# Patient Record
Sex: Male | Born: 1960 | Race: White
Health system: Northeastern US, Academic
[De-identification: ages and names within clinical notes are randomized; demographics above are authoritative.]

## PROBLEM LIST (undated history)

## (undated) DIAGNOSIS — I4891 Unspecified atrial fibrillation: Secondary | ICD-10-CM

## (undated) DIAGNOSIS — D649 Anemia, unspecified: Secondary | ICD-10-CM

## (undated) DIAGNOSIS — R0600 Dyspnea, unspecified: Secondary | ICD-10-CM

## (undated) DIAGNOSIS — E785 Hyperlipidemia, unspecified: Secondary | ICD-10-CM

## (undated) DIAGNOSIS — F32A Depression, unspecified: Secondary | ICD-10-CM

## (undated) DIAGNOSIS — G629 Polyneuropathy, unspecified: Secondary | ICD-10-CM

## (undated) DIAGNOSIS — E079 Disorder of thyroid, unspecified: Secondary | ICD-10-CM

## (undated) DIAGNOSIS — Z973 Presence of spectacles and contact lenses: Secondary | ICD-10-CM

## (undated) DIAGNOSIS — E119 Type 2 diabetes mellitus without complications: Secondary | ICD-10-CM

## (undated) DIAGNOSIS — F17200 Nicotine dependence, unspecified, uncomplicated: Secondary | ICD-10-CM

## (undated) DIAGNOSIS — F329 Major depressive disorder, single episode, unspecified: Secondary | ICD-10-CM

## (undated) DIAGNOSIS — Z8619 Personal history of other infectious and parasitic diseases: Secondary | ICD-10-CM

## (undated) DIAGNOSIS — E039 Hypothyroidism, unspecified: Secondary | ICD-10-CM

## (undated) DIAGNOSIS — J189 Pneumonia, unspecified organism: Secondary | ICD-10-CM

## (undated) HISTORY — PX: KNEE SURGERY: SHX244

## (undated) HISTORY — PX: WISDOM TOOTH EXTRACTION: SHX21

## (undated) HISTORY — PX: FRACTURE SURGERY: SHX138

---

## 1999-07-25 ENCOUNTER — Emergency Department (HOSPITAL_COMMUNITY): Admission: EM | Admit: 1999-07-25 | Discharge: 1999-07-25 | Payer: Self-pay | Admitting: Emergency Medicine

## 2003-06-09 ENCOUNTER — Emergency Department (HOSPITAL_COMMUNITY): Admission: EM | Admit: 2003-06-09 | Discharge: 2003-06-09 | Payer: Self-pay | Admitting: Emergency Medicine

## 2003-09-14 ENCOUNTER — Encounter: Admission: RE | Admit: 2003-09-14 | Discharge: 2003-09-14 | Payer: Self-pay | Admitting: Orthopedic Surgery

## 2011-04-11 ENCOUNTER — Encounter: Payer: Self-pay | Admitting: Physician Assistant

## 2011-10-26 NOTE — Progress Notes (Signed)
This encounter was created in error - please disregard.

## 2014-10-01 ENCOUNTER — Ambulatory Visit (INDEPENDENT_AMBULATORY_CARE_PROVIDER_SITE_OTHER): Payer: Self-pay | Admitting: Urgent Care

## 2014-10-01 VITALS — BP 126/70 | HR 87 | Temp 97.5°F | Resp 18 | Ht 74.41 in | Wt 198.0 lb

## 2014-10-01 DIAGNOSIS — Z021 Encounter for pre-employment examination: Secondary | ICD-10-CM

## 2014-10-01 DIAGNOSIS — R221 Localized swelling, mass and lump, neck: Secondary | ICD-10-CM | POA: Insufficient documentation

## 2014-10-01 DIAGNOSIS — Z720 Tobacco use: Secondary | ICD-10-CM

## 2014-10-01 DIAGNOSIS — F17200 Nicotine dependence, unspecified, uncomplicated: Secondary | ICD-10-CM

## 2014-10-01 DIAGNOSIS — R809 Proteinuria, unspecified: Secondary | ICD-10-CM

## 2014-10-01 NOTE — Progress Notes (Signed)
Commercial Driver Medical Examination   Darren Skinner is a 54 y.o. male who presents today for a DOT physical exam. The patient reports no problems.  The following portions of the patient's history were reviewed and updated as appropriate: allergies, current medications, past family history, past medical history, past social history and past surgical history.  Objective:   BP 126/70 mmHg  Pulse 87  Temp(Src) 97.5 F (36.4 C) (Oral)  Resp 18  Ht 6' 2.41" (1.89 m)  Wt 198 lb (89.812 kg)  BMI 25.14 kg/m2  SpO2 98%  Vision/hearing:  Visual Acuity Screening   Right eye Left eye Both eyes  Without correction:  With correction:     Comments: Peripheral Vision: Right eye 85 degrees. Left eye 85 degrees.  The patient can distinguish the colors red, amber and green.  Hearing Screening Comments: The patient was able to hear a forced whisper from 10 feet.  Corrective lenses required: No  Monocular Vision?: No  Hearing aid requirement: No  Physical Exam  Constitutional: He is oriented to person, place, and time. He appears well-developed and well-nourished.  Eyes: Conjunctivae and EOM are normal. Pupils are equal, round, and reactive to light. Right eye exhibits no discharge. Left eye exhibits no discharge. No scleral icterus.  Neck: Normal range of motion. Neck supple. No thyromegaly present.    Cardiovascular: Normal rate, regular rhythm and intact distal pulses.  Exam reveals no gallop and no friction rub.   No murmur heard. Pulmonary/Chest: No stridor. No respiratory distress. He has no wheezes. He has no rales.  Abdominal: Soft. Bowel sounds are normal. He exhibits no distension and no mass. There is no tenderness.  Musculoskeletal: Normal range of motion. He exhibits no edema or tenderness.  Neurological: He is alert and oriented to person, place, and time. He has normal reflexes.  Skin: Skin is warm and dry. No rash noted. No erythema. No pallor.    Psychiatric: He has a normal mood and affect.   Labs: Comments: Sp. Gr. 1.025, Pro. 30+, BL neg, GLU neg   Assessment:    Healthy male exam.  Meets standards in 26 CFR 391.41;  qualifies for 2 year certificate.    Plan:    Medical examiners certificate completed and printed.   1. Physical exam, pre-employment - Stable, qualified for 2 years.  2. Mass of left side of neck - Stable, unchanged per patient. Denies B symptoms. Patient states that he was previously told by his doctor that it was "fatty tissue". I discussed differential and let him know that it may be an enlarged lymph and should be re-evaluated. It is my medical opinion, however, that this neck mass would not interfere with his driving. Patient will consider following up with his PCP.  3. Proteinuria - Mild proteinuria will not interfere with patient's ability to drive safely on the road. I recommended patient have a recheck done with his PCP.  4. Tobacco use disorder - Patient is trying to quit, declined medical therapy. He will continue efforts to try and cut back.

## 2014-11-08 ENCOUNTER — Inpatient Hospital Stay (HOSPITAL_COMMUNITY)
Admission: EM | Admit: 2014-11-08 | Discharge: 2014-11-14 | DRG: 854 | Disposition: A | Discharge status: Home Health | Payer: Self-pay | Attending: Internal Medicine | Admitting: Internal Medicine

## 2014-11-08 ENCOUNTER — Encounter (HOSPITAL_COMMUNITY): Payer: Self-pay | Admitting: Emergency Medicine

## 2014-11-08 ENCOUNTER — Emergency Department (HOSPITAL_COMMUNITY): Payer: Self-pay

## 2014-11-08 DIAGNOSIS — K1120 Sialoadenitis, unspecified: Secondary | ICD-10-CM | POA: Diagnosis present

## 2014-11-08 DIAGNOSIS — E876 Hypokalemia: Secondary | ICD-10-CM | POA: Diagnosis not present

## 2014-11-08 DIAGNOSIS — L089 Local infection of the skin and subcutaneous tissue, unspecified: Secondary | ICD-10-CM | POA: Diagnosis present

## 2014-11-08 DIAGNOSIS — E11621 Type 2 diabetes mellitus with foot ulcer: Secondary | ICD-10-CM | POA: Diagnosis present

## 2014-11-08 DIAGNOSIS — E1165 Type 2 diabetes mellitus with hyperglycemia: Secondary | ICD-10-CM | POA: Diagnosis present

## 2014-11-08 DIAGNOSIS — E1152 Type 2 diabetes mellitus with diabetic peripheral angiopathy with gangrene: Secondary | ICD-10-CM | POA: Diagnosis present

## 2014-11-08 DIAGNOSIS — R6520 Severe sepsis without septic shock: Secondary | ICD-10-CM | POA: Diagnosis present

## 2014-11-08 DIAGNOSIS — E11628 Type 2 diabetes mellitus with other skin complications: Secondary | ICD-10-CM | POA: Diagnosis present

## 2014-11-08 DIAGNOSIS — I4510 Unspecified right bundle-branch block: Secondary | ICD-10-CM | POA: Diagnosis present

## 2014-11-08 DIAGNOSIS — E039 Hypothyroidism, unspecified: Secondary | ICD-10-CM | POA: Diagnosis present

## 2014-11-08 DIAGNOSIS — I472 Ventricular tachycardia: Secondary | ICD-10-CM | POA: Diagnosis present

## 2014-11-08 DIAGNOSIS — Z8619 Personal history of other infectious and parasitic diseases: Secondary | ICD-10-CM | POA: Diagnosis present

## 2014-11-08 DIAGNOSIS — E1169 Type 2 diabetes mellitus with other specified complication: Secondary | ICD-10-CM | POA: Insufficient documentation

## 2014-11-08 DIAGNOSIS — F17210 Nicotine dependence, cigarettes, uncomplicated: Secondary | ICD-10-CM | POA: Diagnosis present

## 2014-11-08 DIAGNOSIS — L03039 Cellulitis of unspecified toe: Secondary | ICD-10-CM

## 2014-11-08 DIAGNOSIS — IMO0001 Reserved for inherently not codable concepts without codable children: Secondary | ICD-10-CM | POA: Insufficient documentation

## 2014-11-08 DIAGNOSIS — E1142 Type 2 diabetes mellitus with diabetic polyneuropathy: Secondary | ICD-10-CM | POA: Diagnosis present

## 2014-11-08 DIAGNOSIS — L0390 Cellulitis, unspecified: Secondary | ICD-10-CM | POA: Diagnosis present

## 2014-11-08 DIAGNOSIS — L03116 Cellulitis of left lower limb: Secondary | ICD-10-CM | POA: Diagnosis present

## 2014-11-08 DIAGNOSIS — L02612 Cutaneous abscess of left foot: Secondary | ICD-10-CM | POA: Diagnosis present

## 2014-11-08 DIAGNOSIS — R609 Edema, unspecified: Secondary | ICD-10-CM

## 2014-11-08 DIAGNOSIS — E1121 Type 2 diabetes mellitus with diabetic nephropathy: Secondary | ICD-10-CM | POA: Diagnosis present

## 2014-11-08 DIAGNOSIS — IMO0002 Reserved for concepts with insufficient information to code with codable children: Secondary | ICD-10-CM | POA: Diagnosis present

## 2014-11-08 DIAGNOSIS — A419 Sepsis, unspecified organism: Principal | ICD-10-CM

## 2014-11-08 DIAGNOSIS — F39 Unspecified mood [affective] disorder: Secondary | ICD-10-CM | POA: Diagnosis present

## 2014-11-08 DIAGNOSIS — L02619 Cutaneous abscess of unspecified foot: Secondary | ICD-10-CM | POA: Insufficient documentation

## 2014-11-08 DIAGNOSIS — D61818 Other pancytopenia: Secondary | ICD-10-CM

## 2014-11-08 HISTORY — DX: Type 2 diabetes mellitus without complications: E11.9

## 2014-11-08 HISTORY — DX: Disorder of thyroid, unspecified: E07.9

## 2014-11-08 HISTORY — DX: Polyneuropathy, unspecified: G62.9

## 2014-11-08 LAB — CBC WITH DIFFERENTIAL/PLATELET
BASOS ABS: 0 10*3/uL (ref 0.0–0.1)
Basophils Relative: 0 %
EOS PCT: 1 %
Eosinophils Absolute: 0.1 10*3/uL (ref 0.0–0.7)
HCT: 38.5 % — ABNORMAL LOW (ref 39.0–52.0)
Hemoglobin: 13.5 g/dL (ref 13.0–17.0)
LYMPHS PCT: 6 %
Lymphs Abs: 0.6 10*3/uL — ABNORMAL LOW (ref 0.7–4.0)
MCH: 31 pg (ref 26.0–34.0)
MCHC: 35.1 g/dL (ref 30.0–36.0)
MCV: 88.5 fL (ref 78.0–100.0)
MONO ABS: 0.2 10*3/uL (ref 0.1–1.0)
Monocytes Relative: 2 %
Neutro Abs: 8.4 10*3/uL — ABNORMAL HIGH (ref 1.7–7.7)
Neutrophils Relative %: 91 %
PLATELETS: 168 10*3/uL (ref 150–400)
RBC: 4.35 MIL/uL (ref 4.22–5.81)
RDW: 12 % (ref 11.5–15.5)
WBC: 9.3 10*3/uL (ref 4.0–10.5)

## 2014-11-08 LAB — I-STAT CG4 LACTIC ACID, ED
Lactic Acid, Venous: 1.58 mmol/L (ref 0.5–2.0)
Lactic Acid, Venous: 2.38 mmol/L (ref 0.5–2.0)
Lactic Acid, Venous: 1.68 mmol/L (ref 0.5–2.0)

## 2014-11-08 LAB — URINE MICROSCOPIC-ADD ON

## 2014-11-08 LAB — URINALYSIS, ROUTINE W REFLEX MICROSCOPIC
GLUCOSE, UA: NEGATIVE mg/dL
HGB URINE DIPSTICK: NEGATIVE
KETONES UR: 15 mg/dL — AB
NITRITE: NEGATIVE
PH: 5.5 (ref 5.0–8.0)
Protein, ur: 30 mg/dL — AB
Specific Gravity, Urine: 1.029 (ref 1.005–1.030)
Urobilinogen, UA: 1 mg/dL (ref 0.0–1.0)

## 2014-11-08 LAB — COMPREHENSIVE METABOLIC PANEL
ALBUMIN: 4 g/dL (ref 3.5–5.0)
ALK PHOS: 70 U/L (ref 38–126)
ALT: 18 U/L (ref 17–63)
AST: 19 U/L (ref 15–41)
Anion gap: 12 (ref 5–15)
BILIRUBIN TOTAL: 0.9 mg/dL (ref 0.3–1.2)
BUN: 18 mg/dL (ref 6–20)
CO2: 23 mmol/L (ref 22–32)
Calcium: 9.8 mg/dL (ref 8.9–10.3)
Chloride: 104 mmol/L (ref 101–111)
Creatinine, Ser: 0.89 mg/dL (ref 0.61–1.24)
GFR calc Af Amer: >60 mL/min (ref >60)
GFR calc non Af Amer: >60 mL/min (ref >60)
GLUCOSE: 149 mg/dL — AB (ref 65–99)
POTASSIUM: 4 mmol/L (ref 3.5–5.1)
Sodium: 139 mmol/L (ref 135–145)
TOTAL PROTEIN: 7.4 g/dL (ref 6.5–8.1)

## 2014-11-08 LAB — LIPASE, BLOOD: Lipase: 28 U/L (ref 22–51)

## 2014-11-08 MED ORDER — INSULIN ASPART 100 UNIT/ML ~~LOC~~ SOLN
0.0000 [IU] | Freq: Three times a day (TID) | SUBCUTANEOUS | Status: DC
  Administered 2014-11-09: 8 [IU] via SUBCUTANEOUS
  Administered 2014-11-09: 5 [IU] via SUBCUTANEOUS
  Administered 2014-11-09 – 2014-11-10 (×2): 3 [IU] via SUBCUTANEOUS
  Administered 2014-11-10 (×2): 5 [IU] via SUBCUTANEOUS
  Administered 2014-11-11: 3 [IU] via SUBCUTANEOUS
  Administered 2014-11-11: 2 [IU] via SUBCUTANEOUS
  Administered 2014-11-11: 3 [IU] via SUBCUTANEOUS
  Administered 2014-11-12 (×2): 5 [IU] via SUBCUTANEOUS
  Administered 2014-11-12: 3 [IU] via SUBCUTANEOUS
  Administered 2014-11-13: 5 [IU] via SUBCUTANEOUS
  Administered 2014-11-13: 2 [IU] via SUBCUTANEOUS
  Administered 2014-11-13: 5 [IU] via SUBCUTANEOUS
  Administered 2014-11-14: 3 [IU] via SUBCUTANEOUS

## 2014-11-08 MED ORDER — VALACYCLOVIR HCL 500 MG PO TABS
500.0000 mg | ORAL_TABLET | Freq: Every day | ORAL | Status: DC
  Administered 2014-11-09 – 2014-11-13 (×5): 500 mg via ORAL
  Filled 2014-11-08 (×6): qty 1

## 2014-11-08 MED ORDER — ACETAMINOPHEN 325 MG PO TABS
650.0000 mg | ORAL_TABLET | Freq: Four times a day (QID) | ORAL | Status: DC | PRN
  Administered 2014-11-09 – 2014-11-10 (×3): 650 mg via ORAL
  Filled 2014-11-08 (×4): qty 2

## 2014-11-08 MED ORDER — SODIUM CHLORIDE 0.9 % IJ SOLN
3.0000 mL | Freq: Two times a day (BID) | INTRAMUSCULAR | Status: DC
  Administered 2014-11-09 – 2014-11-13 (×7): 3 mL via INTRAVENOUS

## 2014-11-08 MED ORDER — ENOXAPARIN SODIUM 40 MG/0.4ML ~~LOC~~ SOLN
40.0000 mg | SUBCUTANEOUS | Status: DC
Start: 2014-11-08 — End: 2014-11-14
  Administered 2014-11-08 – 2014-11-13 (×5): 40 mg via SUBCUTANEOUS
  Filled 2014-11-08 (×5): qty 0.4

## 2014-11-08 MED ORDER — DULOXETINE HCL 60 MG PO CPEP
60.0000 mg | ORAL_CAPSULE | Freq: Every day | ORAL | Status: DC
  Administered 2014-11-09 – 2014-11-13 (×5): 60 mg via ORAL
  Filled 2014-11-08 (×6): qty 1

## 2014-11-08 MED ORDER — MORPHINE SULFATE (PF) 4 MG/ML IV SOLN
4.0000 mg | Freq: Once | INTRAVENOUS | Status: AC
  Administered 2014-11-08: 4 mg via INTRAVENOUS
  Filled 2014-11-08: qty 1

## 2014-11-08 MED ORDER — ACETAMINOPHEN 650 MG RE SUPP
650.0000 mg | Freq: Four times a day (QID) | RECTAL | Status: DC | PRN
  Administered 2014-11-09: 650 mg via RECTAL
  Filled 2014-11-08: qty 1

## 2014-11-08 MED ORDER — INSULIN ASPART 100 UNIT/ML ~~LOC~~ SOLN
0.0000 [IU] | Freq: Every day | SUBCUTANEOUS | Status: DC
  Administered 2014-11-09 – 2014-11-10 (×2): 2 [IU] via SUBCUTANEOUS

## 2014-11-08 MED ORDER — VANCOMYCIN HCL 10 G IV SOLR
1750.0000 mg | Freq: Once | INTRAVENOUS | Status: AC
  Administered 2014-11-08: 1750 mg via INTRAVENOUS
  Filled 2014-11-08: qty 1750

## 2014-11-08 MED ORDER — FENTANYL CITRATE (PF) 100 MCG/2ML IJ SOLN
25.0000 ug | INTRAMUSCULAR | Status: DC | PRN
  Administered 2014-11-10: 50 ug via INTRAVENOUS
  Filled 2014-11-08: qty 2

## 2014-11-08 MED ORDER — PIPERACILLIN-TAZOBACTAM 3.375 G IVPB
3.3750 g | Freq: Three times a day (TID) | INTRAVENOUS | Status: DC
  Administered 2014-11-09 – 2014-11-13 (×13): 3.375 g via INTRAVENOUS
  Filled 2014-11-08 (×17): qty 50

## 2014-11-08 MED ORDER — SODIUM CHLORIDE 0.9 % IV BOLUS (SEPSIS)
1000.0000 mL | INTRAVENOUS | Status: AC
  Administered 2014-11-08 (×3): 1000 mL via INTRAVENOUS

## 2014-11-08 MED ORDER — PIPERACILLIN-TAZOBACTAM 3.375 G IVPB 30 MIN
3.3750 g | Freq: Once | INTRAVENOUS | Status: AC
  Administered 2014-11-08: 3.375 g via INTRAVENOUS
  Filled 2014-11-08: qty 50

## 2014-11-08 MED ORDER — LEVOTHYROXINE SODIUM 25 MCG PO TABS
175.0000 ug | ORAL_TABLET | Freq: Every day | ORAL | Status: DC
  Administered 2014-11-09 – 2014-11-14 (×6): 175 ug via ORAL
  Filled 2014-11-08 (×13): qty 1

## 2014-11-08 MED ORDER — GABAPENTIN 400 MG PO CAPS
800.0000 mg | ORAL_CAPSULE | Freq: Three times a day (TID) | ORAL | Status: DC
  Administered 2014-11-08 – 2014-11-13 (×15): 800 mg via ORAL
  Filled 2014-11-08 (×17): qty 2

## 2014-11-08 MED ORDER — DEXTROSE 5 % IV SOLN: 2.0000 g | INTRAVENOUS | Status: DC

## 2014-11-08 MED ORDER — HYDROCODONE-ACETAMINOPHEN 5-325 MG PO TABS
1.0000 | ORAL_TABLET | ORAL | Status: DC | PRN
  Administered 2014-11-10 – 2014-11-14 (×14): 2 via ORAL
  Filled 2014-11-08: qty 1
  Filled 2014-11-08 (×7): qty 2
  Filled 2014-11-08: qty 1
  Filled 2014-11-08 (×6): qty 2

## 2014-11-08 MED ORDER — VANCOMYCIN HCL IN DEXTROSE 1-5 GM/200ML-% IV SOLN
1000.0000 mg | Freq: Three times a day (TID) | INTRAVENOUS | Status: DC
  Administered 2014-11-09 – 2014-11-10 (×6): 1000 mg via INTRAVENOUS
  Filled 2014-11-08 (×8): qty 200

## 2014-11-08 MED ORDER — ONDANSETRON HCL 4 MG/2ML IJ SOLN
4.0000 mg | Freq: Once | INTRAMUSCULAR | Status: AC
  Administered 2014-11-08: 4 mg via INTRAVENOUS
  Filled 2014-11-08: qty 2

## 2014-11-08 MED ORDER — METRONIDAZOLE IN NACL 5-0.79 MG/ML-% IV SOLN: 500.0000 mg | Freq: Three times a day (TID) | INTRAVENOUS | Status: DC

## 2014-11-08 NOTE — H&P (Signed)
[facility] History and Physical  Patient: Darren Skinner  MRN: [id number]  DOB: 1960/08/16  DOS: the patient was seen and examined on 11/08/2014 PCP:  Melinda Crutch, MD  Referring physician: Dr. Waverly Ferrari Chief Complaint: Infected left toe  HPI: Darren Skinner is a 54 y.o. male with Past medical history of hypothyroidism, diabetes mellitus with neuropathy, mood disorder. The patient is presenting with complaints of infection of the left toe. Patient mentions that he he has chronic callus which she picks on and off. One month ago he picked his callus on the left foot and later on he started having redness which was followed by increasing swelling. This was followed by increasing pain while emulating over last 1 week. Therefore he decided to go to see his PCP. In the office he was told that he may have to have his toe amputated and related to go to ER for further workup. Despite treatment at home the patient's pain was not improving and therefore he decided to come to the hospital. He denies any fall trauma or injury denies having any fever or chills no chest pain and abdominal pain. He complains of leg tenderness in the left leg. He denies having any burning urination diarrhea or constipation.  The patient is coming from home And is independent for most of his ADL; manages his medication on his own.  Review of Systems: as mentioned in the history of present illness.  A comprehensive review of the other systems is negative.  Past Medical History  Diagnosis Date  . Thyroid disease   . Diabetes mellitus without complication   . Neuropathy    Past Surgical History  Procedure Laterality Date  . Joint replacement      broken arm    Social History:  reports that he has been smoking Cigarettes.  He has been smoking about 1.00 pack per day. He does not have any smokeless tobacco history on file. He reports that he does not drink alcohol or use illicit drugs.  No Known Allergies  No  family history on file.  Prior to Admission medications   Medication Sig Start Date End Date Taking? Authorizing Dreya Buhrman  DULoxetine (CYMBALTA) 60 MG capsule Take 60 mg by mouth daily.   Yes Historical Maiko Salais, MD  gabapentin (NEURONTIN) 800 MG tablet Take 800 mg by mouth 3 (three) times daily.   Yes Historical Kaylin Schellenberg, MD  glimepiride (AMARYL) 2 MG tablet Take 2 mg by mouth daily with breakfast.   Yes Historical Xoey Warmoth, MD  levothyroxine (SYNTHROID, LEVOTHROID) 175 MCG tablet Take 175 mcg by mouth daily before breakfast.   Yes Historical Aiven Kampe, MD  metFORMIN (GLUCOPHAGE-XR) 500 MG 24 hr tablet Take 100 mg by mouth 2 (two) times daily.   Yes Historical Ramandeep Arington, MD  valACYclovir (VALTREX) 500 MG tablet Take 500 mg by mouth daily.   Yes Historical Claron Rosencrans, MD    Physical Exam: Filed Vitals:   11/08/14 2100 11/08/14 2130 11/08/14 2145 11/08/14 2234  BP: 122/76 132/73 130/58 122/78  Pulse: 102 104 105 101  Temp: 99.3 F (37.4 C)   98.8 F (37.1 C)  TempSrc: Oral     Resp: $Remo'26  27 25  'nlojA$ Height:      Weight:      SpO2: 93% 92% 94% 95%    General: Alert, Awake and Oriented to Time, Place and Person. Appear in mild distress Eyes: PERRL ENT: Oral Mucosa clear moist. Neck: no JVD Cardiovascular: S1 and S2 Present, no Murmur, Peripheral Pulses  Present Respiratory: Bilateral Air entry equal and Decreased,  Clear to Auscultation, no Crackles, no wheezes Abdomen: Bowel Sound present, Soft and no tenderness Skin: Significantly swollen left great toe with open ulcer on the bottom, no other Rash Extremities: Left more than right Pedal edema, left more than right calf tenderness Neurologic: Grossly no focal neuro deficit.   Labs on Admission:  CBC:  Recent Labs Lab 11/08/14 1225  WBC 9.3  NEUTROABS 8.4*  HGB 13.5  HCT 38.5*  MCV 88.5  PLT 168    CMP     Component Value Date/Time   NA 139 11/08/2014 1225   K 4.0 11/08/2014 1225   CL 104 11/08/2014 1225   CO2 23  11/08/2014 1225   GLUCOSE 149* 11/08/2014 1225   BUN 18 11/08/2014 1225   CREATININE 0.89 11/08/2014 1225   CALCIUM 9.8 11/08/2014 1225   PROT 7.4 11/08/2014 1225   ALBUMIN 4.0 11/08/2014 1225   AST 19 11/08/2014 1225   ALT 18 11/08/2014 1225   ALKPHOS 70 11/08/2014 1225   BILITOT 0.9 11/08/2014 1225   GFRNONAA >60 11/08/2014 1225   GFRAA >60 11/08/2014 1225    No results for input(s): CKTOTAL, CKMB, CKMBINDEX, TROPONINI in the last 168 hours. BNP (last 3 results) No results for input(s): BNP in the last 8760 hours.  ProBNP (last 3 results) No results for input(s): PROBNP in the last 8760 hours.   Radiological Exams on Admission: Dg Toe Great Left  11/08/2014   CLINICAL DATA:  54 year old with diabetic ulcer involving the left great toe which began approximately 2 months ago.  EXAM: LEFT GREAT TOE 3 VIEWS  COMPARISON:  None.  FINDINGS: Marked diffuse soft tissue swelling. Ulceration involving the plantar surface. No evidence of osteomyelitis. No evidence of acute or subacute fracture or dislocation. Well preserved joint spaces. Well preserved bone mineral density.  IMPRESSION: Soft tissue swelling and ulceration.  No osseous abnormality.   Electronically Signed   By: Evangeline Dakin M.D.   On: 11/08/2014 19:52    Assessment/Plan 1. Diabetic foot infection The patient is presenting with complaints of toe infection. He has an open wound after a callus formation. X-ray does not show any evidence of osteomyelitis. Neck I would check ESR CRP CPK level. We'll also check ultrasound lower extremity Doppler as well as ABI. Patient will be treated broadly with vancomycin and Zosyn. Patient will benefit from orthopedic consultation in the morning about further discussion related to the wound. PTOT consultation in the morning. Wound care is consulted.  2.Hypothyroidism Check TSH, continue Synthroid at home doses.  3.History of shingles Continuing Valtrex at home doses.  4.Mood  disorder Continue home medications at present does not appear to be any significant distress.  5.Type 2 diabetes mellitus, uncontrolled Check hemoglobin A1c, placing him on sliding scale insulin. Holding oral hypoglycemic agents.  Nutrition: Nothing by mouth after midnight.  DVT Prophylaxis: subcutaneous Heparin  Advance goals of care discussion: Full code   Disposition: Admitted as inpatient, telemetry unit. Estimated length of stay: One to 2 days  Author: Berle Mull, MD [facility] Pager: [phone] 11/08/2014  If 7PM-7AM, please contact night-coverage [email or web address] Password [email or web address]

## 2014-11-08 NOTE — ED Notes (Signed)
Called Dr. Blinda Leatherwood to request pain meds. MD acknowledges, awaiting orders.

## 2014-11-08 NOTE — ED Provider Notes (Signed)
CSN: [id number]     Arrival date & time 11/08/14  1129 History   First MD Initiated Contact with Patient 11/08/14 1838     Chief Complaint  Patient presents with  . Wound Infection     (Consider location/radiation/quality/duration/timing/severity/associated sxs/prior Treatment) HPI Comments: Patient presents to the ER for evaluation of toe infection. Patient reports that he was sent here by his doctor. He reports that he started having pain and swelling in his toes percent many weeks ago. He initially thought it was gout. He then noticed that there was a callus on his toe. He did pull the callus off and since then he has been experiencing redness, swelling. He developed a fever today.   Past Medical History  Diagnosis Date  . Thyroid disease   . Diabetes mellitus without complication   . Neuropathy    Past Surgical History  Procedure Laterality Date  . Joint replacement      broken arm    No family history on file. Social History  Substance Use Topics  . Smoking status: Current Every Day Smoker -- 1.00 packs/day    Types: Cigarettes  . Smokeless tobacco: None  . Alcohol Use: No    Review of Systems  Constitutional: Positive for fever.  Skin: Positive for wound.  All other systems reviewed and are negative.     Allergies  Review of patient's allergies indicates no known allergies.  Home Medications   Prior to Admission medications   Not on File   BP 116/64 mmHg  Pulse 94  Temp(Src) 98.8 F (37.1 C) (Oral)  Resp 29  Ht  (1.88 m)  Wt 206 lb (93.441 kg)  BMI 26.44 kg/m2  SpO2 97% Physical Exam  Constitutional: He is oriented to person, place, and time. He appears well-developed and well-nourished. No distress.  HENT:  Head: Normocephalic and atraumatic.  Right Ear: Hearing normal.  Left Ear: Hearing normal.  Nose: Nose normal.  Mouth/Throat: Oropharynx is clear and moist and mucous membranes are normal.  Eyes: Conjunctivae and EOM are normal. Pupils  are equal, round, and reactive to light.  Neck: Normal range of motion. Neck supple.  Cardiovascular: Regular rhythm, S1 normal and S2 normal.  Exam reveals no gallop and no friction rub.   No murmur heard. Pulmonary/Chest: Effort normal and breath sounds normal. No respiratory distress. He exhibits no tenderness.  Abdominal: Soft. Normal appearance and bowel sounds are normal. There is no hepatosplenomegaly. There is no tenderness. There is no rebound, no guarding, no tenderness at McBurney's point and negative Murphy's sign. No hernia.  Musculoskeletal: Normal range of motion.  Neurological: He is alert and oriented to person, place, and time. He has normal strength. No cranial nerve deficit or sensory deficit. Coordination normal. GCS eye subscore is 4. GCS verbal subscore is 5. GCS motor subscore is 6.  Skin: Skin is warm, dry and intact. No rash noted. No cyanosis.  Callus formation over the IP joint of left great toe with significant swelling and erythema of toe. No obvious drainage, but significant odor from the wound  Psychiatric: He has a normal mood and affect. His speech is normal and behavior is normal. Thought content normal.  Nursing note and vitals reviewed.         ED Course  Procedures (including critical care time) Labs Review Labs Reviewed  COMPREHENSIVE METABOLIC PANEL - Abnormal; Notable for the following:    Glucose, Bld 149 (*)    All other components within normal limits  CBC WITH DIFFERENTIAL/PLATELET - Abnormal; Notable for the following:    HCT 38.5 (*)    Neutro Abs 8.4 (*)    Lymphs Abs 0.6 (*)    All other components within normal limits  URINALYSIS, ROUTINE W REFLEX MICROSCOPIC (NOT AT [facility]) - Abnormal; Notable for the following:    Color, Urine AMBER (*)    Bilirubin Urine SMALL (*)    Ketones, ur 15 (*)    Protein, ur 30 (*)    Leukocytes, UA TRACE (*)    All other components within normal limits  URINE MICROSCOPIC-ADD ON - Abnormal; Notable for  the following:    Crystals CA OXALATE CRYSTALS (*)    All other components within normal limits  I-STAT CG4 LACTIC ACID, ED - Abnormal; Notable for the following:    Lactic Acid, Venous 2.38 (*)    All other components within normal limits  CULTURE, BLOOD (ROUTINE X 2)  CULTURE, BLOOD (ROUTINE X 2)  URINE CULTURE  LIPASE, BLOOD  I-STAT CG4 LACTIC ACID, ED    Imaging Review Dg Toe Great Left  11/08/2014   CLINICAL DATA:  54 year old with diabetic ulcer involving the left great toe which began approximately 2 months ago.  EXAM: LEFT GREAT TOE 3 VIEWS  COMPARISON:  None.  FINDINGS: Marked diffuse soft tissue swelling. Ulceration involving the plantar surface. No evidence of osteomyelitis. No evidence of acute or subacute fracture or dislocation. Well preserved joint spaces. Well preserved bone mineral density.  IMPRESSION: Soft tissue swelling and ulceration.  No osseous abnormality.   Electronically Signed   By: Hulan Saas M.D.   On: 11/08/2014 19:52   I have personally reviewed and evaluated these images and lab results as part of my medical decision-making.   EKG Interpretation   Date/Time:  Monday November 08 2014 12:16:58 EDT Ventricular Rate:  109 PR Interval:  134 QRS Duration: 122 QT Interval:  350 QTC Calculation: 471 R Axis:   149 Text Interpretation:  Sinus tachycardia Possible Left atrial enlargement  Right bundle branch block Abnormal ECG No previous tracing Confirmed by  POLLINA  MD, CHRISTOPHER [phone]) on 11/08/2014 8:02:39 PM      MDM   Final diagnoses:  None   diabetic foot ulcer with cellulitis  Patient presents to the ER for evaluation of infection of his left great toe. Patient reports that the wound has been present for some time, is progressively worsening. Examination reveals significant erythema and swelling of the toe with extension to the distal foot. There is a large callus formation with ulceration present. The wound is very malodorous but there  is no significant drainage. He was febrile at arrival to the ER. Patient's lactic acid is slightly elevated. He was mildly tachycardic as well. This is concerning for possible early sepsis. Patient initiated on Zosyn and vancomycin empirically. He will require hospitalization for further management.    Gilda Crease, MD 11/08/14 2004

## 2014-11-08 NOTE — Progress Notes (Signed)
ANTIBIOTIC CONSULT NOTE - INITIAL  Pharmacy Consult for Zosyn and Vancomycin Indication: Wound infection / sepsis  No Known Allergies  Patient Measurements: Height:  (188 cm) Weight: 206 lb (93.441 kg) IBW/kg (Calculated) : 82.2  Vital Signs: Temp: 98.6 F (37 C) (09/19 1903) Temp Source: Oral (09/19 1903) BP: 109/67 mmHg (09/19 1900) Pulse Rate: 95 (09/19 1900) Intake/Output from previous day:   Intake/Output from this shift:    Labs:  Recent Labs  11/08/14 1225  WBC 9.3  HGB 13.5  PLT 168  CREATININE 0.89   Estimated Creatinine Clearance: 110.3 mL/min (by C-G formula based on Cr of 0.89). No results for input(s): VANCOTROUGH, VANCOPEAK, VANCORANDOM, GENTTROUGH, GENTPEAK, GENTRANDOM, TOBRATROUGH, TOBRAPEAK, TOBRARND, AMIKACINPEAK, AMIKACINTROU, AMIKACIN in the last 72 hours.   Microbiology: No results found for this or any previous visit (from the past 720 hour(s)).  Medical History: Past Medical History  Diagnosis Date  . Thyroid disease   . Diabetes mellitus without complication   . Neuropathy     Assessment: 54 yo M presents on 9/19 with a wound infection and patient reports a developing a fever today. Code sepsis was called. Pharmacy consulted to dose abx for wound infection. Afebrile now, WBC wnl. SCr stable, CrCl >140ml/min.   Goal of Therapy:  Vancomycin trough level 15-20 mcg/ml  Plan:  Give Zosyn 3.375g IV (30 min infusion) x 1, then start Zosyn 3.375 gm IV q8h (4 hour infusion) Give vancomycin  IV x 1, then start 1g IV Q8 Monitor clinical picture, renal function, VT prn F/U C&S, abx deescalation / LOT   BATCHELDER,NATHAN J 11/08/2014,7:30 PM

## 2014-11-08 NOTE — ED Notes (Signed)
Called Crystal, phlebotomist to clarify blood culture collection. One set has been collected, second set still needed. Called [facility] to report 2nd set is needed, as well as a lactic acid. He acknowledges.

## 2014-11-08 NOTE — ED Notes (Signed)
Called Dr. Blinda Leatherwood, patient not code sepsis at this time, awaiting orders for fluids and antibiotics.

## 2014-11-08 NOTE — ED Notes (Signed)
Pt's sats noted to be 85% on room air; placed pt on 2L oxygen via [place]

## 2014-11-08 NOTE — ED Notes (Signed)
Patient states toe infection on great toe with pain going up L leg.   Patient went to see his doctor this morning and the provider told him to come here for antibiotics.   Patient pale and diaphoretic at triage.

## 2014-11-09 ENCOUNTER — Inpatient Hospital Stay (HOSPITAL_COMMUNITY): Payer: Self-pay

## 2014-11-09 DIAGNOSIS — IMO0002 Reserved for concepts with insufficient information to code with codable children: Secondary | ICD-10-CM | POA: Diagnosis present

## 2014-11-09 DIAGNOSIS — F39 Unspecified mood [affective] disorder: Secondary | ICD-10-CM | POA: Diagnosis present

## 2014-11-09 DIAGNOSIS — E1165 Type 2 diabetes mellitus with hyperglycemia: Secondary | ICD-10-CM | POA: Diagnosis present

## 2014-11-09 DIAGNOSIS — E039 Hypothyroidism, unspecified: Secondary | ICD-10-CM | POA: Diagnosis present

## 2014-11-09 DIAGNOSIS — R609 Edema, unspecified: Secondary | ICD-10-CM

## 2014-11-09 DIAGNOSIS — Z8619 Personal history of other infectious and parasitic diseases: Secondary | ICD-10-CM | POA: Diagnosis present

## 2014-11-09 LAB — COMPREHENSIVE METABOLIC PANEL
ALBUMIN: 3 g/dL — AB (ref 3.5–5.0)
ALT: 23 U/L (ref 17–63)
AST: 32 U/L (ref 15–41)
Alkaline Phosphatase: 68 U/L (ref 38–126)
Anion gap: 9 (ref 5–15)
BUN: 14 mg/dL (ref 6–20)
CHLORIDE: 101 mmol/L (ref 101–111)
CO2: 24 mmol/L (ref 22–32)
CREATININE: 1.07 mg/dL (ref 0.61–1.24)
Calcium: 8.4 mg/dL — ABNORMAL LOW (ref 8.9–10.3)
GFR calc Af Amer: >60 mL/min (ref >60)
GLUCOSE: 227 mg/dL — AB (ref 65–99)
POTASSIUM: 3.4 mmol/L — AB (ref 3.5–5.1)
Sodium: 134 mmol/L — ABNORMAL LOW (ref 135–145)
Total Bilirubin: 1.6 mg/dL — ABNORMAL HIGH (ref 0.3–1.2)
Total Protein: 6.7 g/dL (ref 6.5–8.1)

## 2014-11-09 LAB — GLUCOSE, CAPILLARY
GLUCOSE-CAPILLARY: 150 mg/dL — AB (ref 65–99)
GLUCOSE-CAPILLARY: 209 mg/dL — AB (ref 65–99)
Glucose-Capillary: 123 mg/dL — ABNORMAL HIGH (ref 65–99)
Glucose-Capillary: 153 mg/dL — ABNORMAL HIGH (ref 65–99)
Glucose-Capillary: 252 mg/dL — ABNORMAL HIGH (ref 65–99)
Glucose-Capillary: 212 mg/dL — ABNORMAL HIGH (ref 65–99)

## 2014-11-09 LAB — URINE CULTURE: Culture: NO GROWTH

## 2014-11-09 LAB — CBC WITH DIFFERENTIAL/PLATELET
BASOS ABS: 0 10*3/uL (ref 0.0–0.1)
BASOS PCT: 0 %
EOS PCT: 0 %
Eosinophils Absolute: 0 10*3/uL (ref 0.0–0.7)
HCT: 33 % — ABNORMAL LOW (ref 39.0–52.0)
Hemoglobin: 11.3 g/dL — ABNORMAL LOW (ref 13.0–17.0)
LYMPHS PCT: 5 %
Lymphs Abs: 0.6 10*3/uL — ABNORMAL LOW (ref 0.7–4.0)
MCH: 30.5 pg (ref 26.0–34.0)
MCHC: 34.2 g/dL (ref 30.0–36.0)
MCV: 88.9 fL (ref 78.0–100.0)
Monocytes Absolute: 0.5 10*3/uL (ref 0.1–1.0)
Monocytes Relative: 4 %
NEUTROS ABS: 11.2 10*3/uL — AB (ref 1.7–7.7)
Neutrophils Relative %: 91 %
PLATELETS: 142 10*3/uL — AB (ref 150–400)
RBC: 3.71 MIL/uL — AB (ref 4.22–5.81)
RDW: 12.1 % (ref 11.5–15.5)
WBC: 12.2 10*3/uL — AB (ref 4.0–10.5)

## 2014-11-09 LAB — TROPONIN I
TROPONIN I: 0.04 ng/mL — AB (ref <0.031)
Troponin I: <0.03 ng/mL (ref <0.031)

## 2014-11-09 LAB — CBC
HCT: 35.3 % — ABNORMAL LOW (ref 39.0–52.0)
Hemoglobin: 12.3 g/dL — ABNORMAL LOW (ref 13.0–17.0)
MCH: 31 pg (ref 26.0–34.0)
MCHC: 34.8 g/dL (ref 30.0–36.0)
MCV: 88.9 fL (ref 78.0–100.0)
Platelets: 140 K/uL — ABNORMAL LOW (ref 150–400)
RBC: 3.97 MIL/uL — ABNORMAL LOW (ref 4.22–5.81)
RDW: 12.2 % (ref 11.5–15.5)
WBC: 3.6 K/uL — ABNORMAL LOW (ref 4.0–10.5)

## 2014-11-09 LAB — LACTIC ACID, PLASMA
LACTIC ACID, VENOUS: 2.1 mmol/L — AB (ref 0.5–2.0)
LACTIC ACID, VENOUS: 1.3 mmol/L (ref 0.5–2.0)

## 2014-11-09 LAB — PREALBUMIN: PREALBUMIN: 16.8 mg/dL — AB (ref 18–38)

## 2014-11-09 LAB — HIV ANTIBODY (ROUTINE TESTING W REFLEX): HIV Screen 4th Generation wRfx: NONREACTIVE

## 2014-11-09 LAB — MRSA PCR SCREENING: MRSA by PCR: NEGATIVE

## 2014-11-09 LAB — C-REACTIVE PROTEIN: CRP: 17.1 mg/dL — ABNORMAL HIGH (ref <1.0)

## 2014-11-09 LAB — MAGNESIUM: Magnesium: 1.4 mg/dL — ABNORMAL LOW (ref 1.7–2.4)

## 2014-11-09 LAB — TSH: TSH: 0.371 u[IU]/mL (ref 0.350–4.500)

## 2014-11-09 LAB — SEDIMENTATION RATE: SED RATE: 56 mm/h — AB (ref 0–16)

## 2014-11-09 LAB — CK: Total CK: 63 U/L (ref 49–397)

## 2014-11-09 MED ORDER — IBUPROFEN 200 MG PO TABS
400.0000 mg | ORAL_TABLET | Freq: Once | ORAL | Status: AC
  Administered 2014-11-09: 400 mg via ORAL
  Filled 2014-11-09: qty 2

## 2014-11-09 MED ORDER — METOPROLOL TARTRATE 1 MG/ML IV SOLN
5.0000 mg | Freq: Four times a day (QID) | INTRAVENOUS | Status: DC
  Administered 2014-11-09: 5 mg via INTRAVENOUS
  Filled 2014-11-09 (×4): qty 5

## 2014-11-09 MED ORDER — METOPROLOL TARTRATE 1 MG/ML IV SOLN
5.0000 mg | Freq: Four times a day (QID) | INTRAVENOUS | Status: DC
  Filled 2014-11-09 (×3): qty 5

## 2014-11-09 MED ORDER — GLUCERNA SHAKE PO LIQD
237.0000 mL | Freq: Three times a day (TID) | ORAL | Status: DC
  Administered 2014-11-09 – 2014-11-13 (×10): 237 mL via ORAL

## 2014-11-09 MED ORDER — SODIUM CHLORIDE 0.9 % IV SOLN
INTRAVENOUS | Status: DC
  Administered 2014-11-09: 14:00:00 via INTRAVENOUS

## 2014-11-09 MED ORDER — METOPROLOL TARTRATE 1 MG/ML IV SOLN
2.5000 mg | Freq: Four times a day (QID) | INTRAVENOUS | Status: DC
  Administered 2014-11-10 (×3): 2.5 mg via INTRAVENOUS
  Filled 2014-11-09 (×4): qty 5

## 2014-11-09 MED ORDER — LORAZEPAM 2 MG/ML IJ SOLN
1.0000 mg | INTRAMUSCULAR | Status: AC
  Administered 2014-11-09: 1 mg via INTRAVENOUS

## 2014-11-09 MED ORDER — SODIUM CHLORIDE 0.9 % IV BOLUS (SEPSIS)
1000.0000 mL | Freq: Once | INTRAVENOUS | Status: AC
  Administered 2014-11-09: 1000 mL via INTRAVENOUS

## 2014-11-09 MED ORDER — SODIUM CHLORIDE 0.9 % IV BOLUS (SEPSIS)
500.0000 mL | Freq: Once | INTRAVENOUS | Status: AC
  Administered 2014-11-09: 500 mL via INTRAVENOUS

## 2014-11-09 MED ORDER — LORAZEPAM 2 MG/ML IJ SOLN
INTRAMUSCULAR | Status: AC
  Filled 2014-11-09: qty 1

## 2014-11-09 NOTE — Progress Notes (Signed)
Utilization review completed.  

## 2014-11-09 NOTE — Progress Notes (Signed)
CRITICAL VALUE ALERT  Critical value received:  Lactic acid 2.1  Date of notification:  11/09/14  Time of notification:  1355  Critical value read back:Yes.    Nurse who received alert:  D. Madelin Rear, RN  MD notified (1st page):  Dr. Mahala Menghini  Time of first page:  1408  MD notified (2nd page):  Time of second page:  Responding MD:  Dr. Mahala Menghini  Time MD responded:  [phone]

## 2014-11-09 NOTE — Progress Notes (Signed)
Initial Nutrition Assessment  DOCUMENTATION CODES:   Not applicable  INTERVENTION:   Provide Glucerna Shake po TID, each supplement provides 220 kcal and 10 grams of protein.  Encourage adequate PO intake.   NUTRITION DIAGNOSIS:   Increased nutrient needs related to wound healing as evidenced by estimated needs.  GOAL:   Patient will meet greater than or equal to 90% of their needs  MONITOR:   PO intake, Weight trends, Supplement acceptance, Labs, I & O's  REASON FOR ASSESSMENT:   Consult Wound healing  ASSESSMENT:   54 y.o. male with Past medical history of hypothyroidism, diabetes mellitus with neuropathy, mood disorder. The patient is presenting with complaints of infection of the left toe.  Pt unavailable during time of visit. Unable to obtain nutrition hx or perform the nutrition focused physical exam. Per Epic weight records, weight has been stable. Diet has been advanced this morning. RD to order nutritional supplements to aid in wound healing.   Labs: Low sodium, potassium, and calcium. High total bilirubin.   Diet Order:  Diet Carb Modified Fluid consistency:: Thin; Room service appropriate?: Yes  Skin:  Wound (see comment) (Diabetic ulcer L toe)  Last BM:  9/18  Height:   Ht Readings from Last 1 Encounters:  11/08/14  (1.88 m)    Weight:   Wt Readings from Last 1 Encounters:  11/09/14 212 lb 4.9 oz (96.3 kg)    Ideal Body Weight:  86.36 kg  BMI:  Body mass index is 27.25 kg/(m^2).  Estimated Nutritional Needs:   Kcal:  2300-2550  Protein:  115-130 grams  Fluid:  2.3 - 2.5 L/day  EDUCATION NEEDS:   No education needs identified at this time  Roslyn Smiling, MS, RD, LDN Pager # [phone] After hours/ weekend pager # [phone]

## 2014-11-09 NOTE — Care Management Note (Addendum)
Case Management Note  Patient Details  Name: Darren Skinner MRN: [id number] Date of Birth: January 06, 1961  Subjective/Objective:                 Admitted with infection of L toe. Hx of hypothyroidism, diabetes mellitus with neuropathy, mood disorder. ADL's independent prior to admit.    Action/Plan:  Return to home when medically stable. CM to f/u with d/c disposition. Expected Discharge Date:                  Expected Discharge Plan:  Home/Self Care  In-House Referral:  Financial Counselor (Pt with no insurance)  Discharge planning Services  CM Consult  Post Acute Care Choice:    Choice offered to:     DME Arranged:    DME Agency:     HH Arranged:    HH Agency:     Status of Service:  In process, will continue to follow  Medicare Important Message Given:    Date Medicare IM Given:    Medicare IM give by:    Date Additional Medicare IM Given:    Additional Medicare Important Message give by:     If discussed at Long Length of Stay Meetings, dates discussed:    Additional Comments: CM received consult for [facility] needs and equipment. CM asked MD for PT evaluation.CM to f/u after results of PT's assessment regarding HH needs/equipment .  Gae Gallop [place], [place] [phone] 11/09/2014, 6:26 PM

## 2014-11-09 NOTE — Consult Note (Signed)
Reason for Consult: Left toe infection Referring Physician: Dr. Berdie Ogren Darren Skinner is an 54 y.o. male.  HPI: Darren Skinner is a 54 year old patient with one-week history of left great toe ulceration drainage and infection. Currently admitted to the hospital this morning to the regular floor when he developed fevers and chills and high blood pressure and chest pain subsequent transferred to the unit. Plain radiographs unremarkable for osteomyelitis he is currently on IV anabiotic's denies any history of trauma to the left toe is never had any issues with the toe previously to a week ago vascular study performed today by her report showed good perfusion patient does have diabetes and neuropathy.  Past Medical History  Diagnosis Date  . Thyroid disease   . Diabetes mellitus without complication   . Neuropathy     Past Surgical History  Procedure Laterality Date  . Joint replacement      broken arm     No family history on file.  Social History:  reports that he has been smoking Cigarettes.  He has been smoking about 1.00 pack per day. He does not have any smokeless tobacco history on file. He reports that he does not drink alcohol or use illicit drugs.  Allergies: No Known Allergies  Medications: I have reviewed the patient's current medications.  Results for orders placed or performed during the hospital encounter of 11/08/14 (from the past 48 hour(s))  Blood Culture (routine x 2)     Status: None (Preliminary result)   Collection Time: 11/08/14 12:23 PM  Result Value Ref Range   Specimen Description BLOOD LEFT FOREARM    Special Requests BOTTLES DRAWN AEROBIC AND ANAEROBIC 10CC    Culture  Setup Time      GRAM POSITIVE COCCI IN CHAINS IN BOTH AEROBIC AND ANAEROBIC BOTTLES CRITICAL RESULT CALLED TO, READ BACK BY AND VERIFIED WITH: Cherlynn June $[email or web address]$  11/09/14 MKELLY    Culture NO GROWTH 1 DAY    Report Status PENDING   Comprehensive metabolic panel     Status: Abnormal   Collection Time:  11/08/14 12:25 PM  Result Value Ref Range   Sodium 139 135 - 145 mmol/L   Potassium 4.0 3.5 - 5.1 mmol/L   Chloride 104 101 - 111 mmol/L   CO2 23 22 - 32 mmol/L   Glucose, Bld 149 (H) 65 - 99 mg/dL   BUN 18 6 - 20 mg/dL   Creatinine, Ser 0.89 0.61 - 1.24 mg/dL   Calcium 9.8 8.9 - 10.3 mg/dL   Total Protein 7.4 6.5 - 8.1 g/dL   Albumin 4.0 3.5 - 5.0 g/dL   AST 19 15 - 41 U/L   ALT 18 17 - 63 U/L   Alkaline Phosphatase 70 38 - 126 U/L   Total Bilirubin 0.9 0.3 - 1.2 mg/dL   GFR calc non Af Amer >60 >60 mL/min   GFR calc Af Amer >60 >60 mL/min    Comment: (NOTE) The eGFR has been calculated using the CKD EPI equation. This calculation has not been validated in all clinical situations. eGFR's persistently <60 mL/min signify possible Chronic Kidney Disease.    Anion gap 12 5 - 15  CBC WITH DIFFERENTIAL     Status: Abnormal   Collection Time: 11/08/14 12:25 PM  Result Value Ref Range   WBC 9.3 4.0 - 10.5 K/uL   RBC 4.35 4.22 - 5.81 MIL/uL   Hemoglobin 13.5 13.0 - 17.0 g/dL   HCT 38.5 (L) 39.0 - 52.0 %  MCV 88.5 78.0 - 100.0 fL   MCH 31.0 26.0 - 34.0 pg   MCHC 35.1 30.0 - 36.0 g/dL   RDW 12.0 11.5 - 15.5 %   Platelets 168 150 - 400 K/uL   Neutrophils Relative % 91 %   Neutro Abs 8.4 (H) 1.7 - 7.7 K/uL   Lymphocytes Relative 6 %   Lymphs Abs 0.6 (L) 0.7 - 4.0 K/uL   Monocytes Relative 2 %   Monocytes Absolute 0.2 0.1 - 1.0 K/uL   Eosinophils Relative 1 %   Eosinophils Absolute 0.1 0.0 - 0.7 K/uL   Basophils Relative 0 %   Basophils Absolute 0.0 0.0 - 0.1 K/uL  Lipase, blood     Status: None   Collection Time: 11/08/14 12:25 PM  Result Value Ref Range   Lipase 28 22 - 51 U/L  I-Stat CG4 Lactic Acid, ED  (not at  [facility])     Status: None   Collection Time: 11/08/14 12:40 PM  Result Value Ref Range   Lactic Acid, Venous 1.58 0.5 - 2.0 mmol/L  I-Stat CG4 Lactic Acid, ED  (not at  [facility])     Status: Abnormal   Collection Time: 11/08/14  6:48 PM  Result Value Ref Range    Lactic Acid, Venous 2.38 (HH) 0.5 - 2.0 mmol/L   Comment NOTIFIED PHYSICIAN   Urinalysis, Routine w reflex microscopic (not at [facility])     Status: Abnormal   Collection Time: 11/08/14  6:55 PM  Result Value Ref Range   Color, Urine AMBER (A) YELLOW    Comment: BIOCHEMICALS MAY BE AFFECTED BY COLOR   APPearance CLEAR CLEAR   Specific Gravity, Urine 1.029 1.005 - 1.030   pH 5.5 5.0 - 8.0   Glucose, UA NEGATIVE NEGATIVE mg/dL   Hgb urine dipstick NEGATIVE NEGATIVE   Bilirubin Urine SMALL (A) NEGATIVE   Ketones, ur 15 (A) NEGATIVE mg/dL   Protein, ur 30 (A) NEGATIVE mg/dL   Urobilinogen, UA 1.0 0.0 - 1.0 mg/dL   Nitrite NEGATIVE NEGATIVE   Leukocytes, UA TRACE (A) NEGATIVE  Urine culture     Status: None   Collection Time: 11/08/14  6:55 PM  Result Value Ref Range   Specimen Description URINE, CLEAN CATCH    Special Requests NONE    Culture NO GROWTH 1 DAY    Report Status 11/09/2014 FINAL   Urine microscopic-add on     Status: Abnormal   Collection Time: 11/08/14  6:55 PM  Result Value Ref Range   Squamous Epithelial / LPF RARE RARE   WBC, UA 0-2 <3 WBC/hpf   RBC / HPF 0-2 <3 RBC/hpf   Bacteria, UA RARE RARE   Crystals CA OXALATE CRYSTALS (A) NEGATIVE   Urine-Other MUCOUS PRESENT   Blood Culture (routine x 2)     Status: None (Preliminary result)   Collection Time: 11/08/14 10:14 PM  Result Value Ref Range   Specimen Description BLOOD RIGHT FOREARM    Special Requests BOTTLES DRAWN AEROBIC AND ANAEROBIC 10CC    Culture NO GROWTH < 24 HOURS    Report Status PENDING   I-Stat CG4 Lactic Acid, ED     Status: None   Collection Time: 11/08/14 10:20 PM  Result Value Ref Range   Lactic Acid, Venous 1.68 0.5 - 2.0 mmol/L  Glucose, capillary     Status: Abnormal   Collection Time: 11/08/14 11:13 PM  Result Value Ref Range   Glucose-Capillary 150 (H) 65 - 99 mg/dL  CK  Status: None   Collection Time: 11/09/14 12:42 AM  Result Value Ref Range   Total CK 63 49 - 397 U/L   C-reactive protein     Status: Abnormal   Collection Time: 11/09/14 12:42 AM  Result Value Ref Range   CRP 17.1 (H) <1.0 mg/dL  HIV antibody     Status: None   Collection Time: 11/09/14 12:42 AM  Result Value Ref Range   HIV Screen 4th Generation wRfx Non Reactive Non Reactive    Comment: (NOTE) Performed At: [facility] [place], [place] [id number] Lindon Romp MD MO:[id number]   Prealbumin     Status: Abnormal   Collection Time: 11/09/14 12:42 AM  Result Value Ref Range   Prealbumin 16.8 (L) 18 - 38 mg/dL  TSH     Status: None   Collection Time: 11/09/14 12:42 AM  Result Value Ref Range   TSH 0.371 0.350 - 4.500 uIU/mL  Sedimentation rate     Status: Abnormal   Collection Time: 11/09/14 12:42 AM  Result Value Ref Range   Sed Rate 56 (H) 0 - 16 mm/hr  CBC with Differential/Platelet     Status: Abnormal   Collection Time: 11/09/14  6:00 AM  Result Value Ref Range   WBC 12.2 (H) 4.0 - 10.5 K/uL   RBC 3.71 (L) 4.22 - 5.81 MIL/uL   Hemoglobin 11.3 (L) 13.0 - 17.0 g/dL   HCT 33.0 (L) 39.0 - 52.0 %   MCV 88.9 78.0 - 100.0 fL   MCH 30.5 26.0 - 34.0 pg   MCHC 34.2 30.0 - 36.0 g/dL   RDW 12.1 11.5 - 15.5 %   Platelets 142 (L) 150 - 400 K/uL   Neutrophils Relative % 91 %   Neutro Abs 11.2 (H) 1.7 - 7.7 K/uL   Lymphocytes Relative 5 %   Lymphs Abs 0.6 (L) 0.7 - 4.0 K/uL   Monocytes Relative 4 %   Monocytes Absolute 0.5 0.1 - 1.0 K/uL   Eosinophils Relative 0 %   Eosinophils Absolute 0.0 0.0 - 0.7 K/uL   Basophils Relative 0 %   Basophils Absolute 0.0 0.0 - 0.1 K/uL  Comprehensive metabolic panel     Status: Abnormal   Collection Time: 11/09/14  6:00 AM  Result Value Ref Range   Sodium 134 (L) 135 - 145 mmol/L   Potassium 3.4 (L) 3.5 - 5.1 mmol/L   Chloride 101 101 - 111 mmol/L   CO2 24 22 - 32 mmol/L   Glucose, Bld 227 (H) 65 - 99 mg/dL   BUN 14 6 - 20 mg/dL   Creatinine, Ser 1.07 0.61 - 1.24 mg/dL   Calcium 8.4 (L) 8.9 - 10.3 mg/dL   Total  Protein 6.7 6.5 - 8.1 g/dL   Albumin 3.0 (L) 3.5 - 5.0 g/dL   AST 32 15 - 41 U/L   ALT 23 17 - 63 U/L   Alkaline Phosphatase 68 38 - 126 U/L   Total Bilirubin 1.6 (H) 0.3 - 1.2 mg/dL   GFR calc non Af Amer >60 >60 mL/min   GFR calc Af Amer >60 >60 mL/min    Comment: (NOTE) The eGFR has been calculated using the CKD EPI equation. This calculation has not been validated in all clinical situations. eGFR's persistently <60 mL/min signify possible Chronic Kidney Disease.    Anion gap 9 5 - 15  Glucose, capillary     Status: Abnormal   Collection Time: 11/09/14  6:47 AM  Result  Value Ref Range   Glucose-Capillary 209 (H) 65 - 99 mg/dL  Glucose, capillary     Status: Abnormal   Collection Time: 11/09/14 12:09 PM  Result Value Ref Range   Glucose-Capillary 123 (H) 65 - 99 mg/dL  Magnesium     Status: Abnormal   Collection Time: 11/09/14 12:35 PM  Result Value Ref Range   Magnesium 1.4 (L) 1.7 - 2.4 mg/dL  Troponin I (q 6hr x 3)     Status: None   Collection Time: 11/09/14 12:35 PM  Result Value Ref Range   Troponin I <0.03 <0.031 ng/mL    Comment:        NO INDICATION OF MYOCARDIAL INJURY.   Culture, blood (routine x 2)     Status: None (Preliminary result)   Collection Time: 11/09/14 12:35 PM  Result Value Ref Range   Specimen Description BLOOD RIGHT ARM    Special Requests BOTTLES DRAWN AEROBIC AND ANAEROBIC [place]    Culture PENDING    Report Status PENDING   Lactic acid, plasma     Status: Abnormal   Collection Time: 11/09/14 12:35 PM  Result Value Ref Range   Lactic Acid, Venous 2.1 (HH) 0.5 - 2.0 mmol/L    Comment: CRITICAL RESULT CALLED TO, READ BACK BY AND VERIFIED WITH: DAVIS W RN 11/09/14 1346 COSTELLO B   CBC     Status: Abnormal   Collection Time: 11/09/14 12:35 PM  Result Value Ref Range   WBC 3.6 (L) 4.0 - 10.5 K/uL   RBC 3.97 (L) 4.22 - 5.81 MIL/uL   Hemoglobin 12.3 (L) 13.0 - 17.0 g/dL   HCT 35.3 (L) 39.0 - 52.0 %   MCV 88.9 78.0 - 100.0 fL   MCH 31.0 26.0  - 34.0 pg   MCHC 34.8 30.0 - 36.0 g/dL   RDW 12.2 11.5 - 15.5 %   Platelets 140 (L) 150 - 400 K/uL  Troponin I (q 6hr x 3)     Status: None   Collection Time: 11/09/14 12:35 PM  Result Value Ref Range   Troponin I <0.03 <0.031 ng/mL    Comment:        NO INDICATION OF MYOCARDIAL INJURY.   Glucose, capillary     Status: Abnormal   Collection Time: 11/09/14  1:45 PM  Result Value Ref Range   Glucose-Capillary 153 (H) 65 - 99 mg/dL  MRSA PCR Screening     Status: None   Collection Time: 11/09/14  2:13 PM  Result Value Ref Range   MRSA by PCR NEGATIVE NEGATIVE    Comment:        The GeneXpert MRSA Assay (FDA approved for NASAL specimens only), is one component of a comprehensive MRSA colonization surveillance program. It is not intended to diagnose MRSA infection nor to guide or monitor treatment for MRSA infections.   Troponin I (q 6hr x 3)     Status: Abnormal   Collection Time: 11/09/14  4:30 PM  Result Value Ref Range   Troponin I 0.04 (H) <0.031 ng/mL    Comment:        PERSISTENTLY INCREASED TROPONIN VALUES IN THE RANGE OF 0.04-0.49 ng/mL CAN BE SEEN IN:       -UNSTABLE ANGINA       -CONGESTIVE HEART FAILURE       -MYOCARDITIS       -CHEST TRAUMA       -ARRYHTHMIAS       -LATE PRESENTING MYOCARDIAL INFARCTION       -  COPD   CLINICAL FOLLOW-UP RECOMMENDED.   Lactic acid, plasma     Status: None   Collection Time: 11/09/14  4:30 PM  Result Value Ref Range   Lactic Acid, Venous 1.3 0.5 - 2.0 mmol/L  Glucose, capillary     Status: Abnormal   Collection Time: 11/09/14  5:21 PM  Result Value Ref Range   Glucose-Capillary 252 (H) 65 - 99 mg/dL    Dg Toe Great Left  11/08/2014   CLINICAL DATA:  54 year old with diabetic ulcer involving the left great toe which began approximately 2 months ago.  EXAM: LEFT GREAT TOE 3 VIEWS  COMPARISON:  None.  FINDINGS: Marked diffuse soft tissue swelling. Ulceration involving the plantar surface. No evidence of osteomyelitis. No  evidence of acute or subacute fracture or dislocation. Well preserved joint spaces. Well preserved bone mineral density.  IMPRESSION: Soft tissue swelling and ulceration.  No osseous abnormality.   Electronically Signed   By: Evangeline Dakin M.D.   On: 11/08/2014 19:52    Review of Systems  Constitutional: Positive for fever.  HENT: Negative.   Eyes: Negative.   Respiratory: Negative.   Cardiovascular: Positive for chest pain.  Gastrointestinal: Negative.   Genitourinary: Negative.   Musculoskeletal: Negative.   Skin: Negative.   Neurological: Negative.   Endo/Heme/Allergies: Negative.   Psychiatric/Behavioral: Negative.    Blood pressure 97/65, pulse 85, temperature 99.5 F (37.5 C), temperature source Oral, resp. rate 21, height $RemoveBe'6\' 2"'upFduVOAB$  (1.88 m), weight 96.3 kg (212 lb 4.9 oz), SpO2 96 %. Physical Exam  Constitutional: He appears well-developed.  HENT:  Head: Normocephalic.  Eyes: Pupils are equal, round, and reactive to light.  Neck: Normal range of motion.  Cardiovascular: Normal rate.   Respiratory: Effort normal.  Neurological: He is alert.  Skin: Skin is warm.  Psychiatric: He has a normal mood and affect.   examination of the left foot demonstrates perfused foot palpable pulses manage sensation consistent with neuropathy swelling and erythema in the left great toe with a plantar ulcer. Slight amount of erythema along the dorsal MTP joints to 3 and 4 of fluctuance or crepitus in the tissues is noted ankle range of motion is intact he'll cord not contracted no plantar ulcers noted under the metatarsal heads  Assessment/Plan: Impression is left great toe infection 1 week duration with drainage toe itself looks fairly swollen erythematous at this time is when her car surgical intervention plan x-rays negative process myelitis I think it's possible he may have some osteomyelitis however would like to get MRI scan of the great toe to define presence or absence of osteo myelitis to  guide surgical therapy plan for that for tomorrow patient currently stable afebrile nonseptic appearing on IV anabiotic's  DEAN,GREGORY SCOTT 11/09/2014, 6:55 PM

## 2014-11-09 NOTE — Progress Notes (Signed)
Pt transferred to 3 south rm 1 after Rapid Response on 5 N.  Pt tachy, shivering, c/o chest pain, BP 240/100, T 101.8-decision made to xfer.

## 2014-11-09 NOTE — Progress Notes (Signed)
Darren Skinner BMW:[id number] DOB: 1960/04/19 DOA: 11/08/2014 PCP:  Duane Lope, MD  Brief narrative: 54 y/o ? DM ty ii nephropathy + Proteinuria Mass L side neck diagnosed recently ? lymph node Continued Tob use Prior shingles to the eye under care of OPtho Mood disorder   Past medical history-As per Problem list Chart reviewed as below- Reviewed  Consultants:  VVS  Procedures:   none yet  Antibiotics:  Zosyn  Vancomycin   Subjective  Feeling a little better  Pain is reasonable Hungry No cp no fever No chills when I saw the patient initially  I was called back to the patient's bedside as patient was having shaking chills rigors heart rate in the 120s as well as chest pressure and discomfort Rapid response at the bedside Rectal temp 101.3 Heart rate 120 Blood pressure 200s over 100s   Objective    Interim History:   Telemetry:    Objective: Filed Vitals:   11/08/14 2234 11/09/14 0200 11/09/14 0500 11/09/14 0516  BP: 122/78 105/57  96/61  Pulse: 101 101  72  Temp: 98.8 F (37.1 C) 102.4 F (39.1 C)  98.4 F (36.9 C)  TempSrc:  Oral  Oral  Resp: Height:      Weight:   96.3 kg (212 lb 4.9 oz)   SpO2: 95%   94%    Intake/Output Summary (Last 24 hours) at 11/09/14 1039 Last data filed at 11/09/14 0519  Gross per 24 hour  Intake   3250 ml  Output    900 ml  Net   2350 ml    Exam:  General: eomi-some  there is a mid to one of the eyes  no specific other issues  S1-S2 no murmur rub or gallopRespiratory:  clinically clear no added sound Abdomen:  soft nontender nondistended  I did not examine the wound today as it has just been pictured in the emergency medicine physician's note Intact neurologically Pulses are bounding in both Dorsalis pedis.  ANt tib is intact    Data Reviewed: Basic Metabolic Panel:  Recent Labs Lab 11/08/14 1225 11/09/14 0600  NA 139 134*  K 4.0 3.4*  CL 104 101  CO2 23 24  GLUCOSE 149* 227*  BUN  18 14  CREATININE 0.89 1.07  CALCIUM 9.8 8.4*   Liver Function Tests:  Recent Labs Lab 11/08/14 1225 11/09/14 0600  AST 19 32  ALT 18 23  ALKPHOS 70 68  BILITOT 0.9 1.6*  PROT 7.4 6.7  ALBUMIN 4.0 3.0*    Recent Labs Lab 11/08/14 1225  LIPASE 28   No results for input(s): AMMONIA in the last 168 hours. CBC:  Recent Labs Lab 11/08/14 1225 11/09/14 0600  WBC 9.3 12.2*  NEUTROABS 8.4* 11.2*  HGB 13.5 11.3*  HCT 38.5* 33.0*  MCV 88.5 88.9  PLT 168 142*   Cardiac Enzymes:  Recent Labs Lab 11/09/14 0042  CKTOTAL 63   BNP: Invalid input(s): POCBNP CBG:  Recent Labs Lab 11/08/14 2313 11/09/14 0647  GLUCAP 150* 209*    Recent Results (from the past 240 hour(s))  Blood Culture (routine x 2)     Status: None (Preliminary result)   Collection Time: 11/08/14 12:23 PM  Result Value Ref Range Status   Specimen Description BLOOD LEFT FOREARM  Final   Special Requests BOTTLES DRAWN AEROBIC AND ANAEROBIC 10CC  Final   Culture  Setup Time   Final    GRAM POSITIVE COCCI IN CHAINS  IN BOTH AEROBIC AND ANAEROBIC BOTTLES CRITICAL RESULT CALLED TO, READ BACK BY AND VERIFIED WITH: Jacques Navy  11/09/14 MKELLY    Culture PENDING  Incomplete   Report Status PENDING  Incomplete  Urine culture     Status: None (Preliminary result)   Collection Time: 11/08/14  6:55 PM  Result Value Ref Range Status   Specimen Description URINE, CLEAN CATCH  Final   Special Requests NONE  Final   Culture NO GROWTH < 12 HOURS  Final   Report Status PENDING  Incomplete     Studies:              All Imaging reviewed and is as per above notation   Scheduled Meds: . DULoxetine  60 mg Oral Daily  . enoxaparin (LOVENOX) injection  40 mg Subcutaneous Q24H  . gabapentin  800 mg Oral TID  . insulin aspart  0-15 Units Subcutaneous TID WC  . insulin aspart  0-5 Units Subcutaneous QHS  . levothyroxine  175 mcg Oral QAC breakfast  . piperacillin-tazobactam (ZOSYN)  IV  3.375 g Intravenous 3  times per day  . sodium chloride  3 mL Intravenous Q12H  . valACYclovir  500 mg Oral Daily  . vancomycin  1,000 mg Intravenous Q8H   Continuous Infusions:    Assessment/Plan:  Moderate to severe sepsis -Likely etiology is lower extremity wound Cycle blood pressure, lactic acid, Pro calcitonin, repeat blood culture and CBC now as patient does have signs and symptoms of early sepsis IV fluid bolus to be given 1000 mL over 1 hour and we will keep on a rate of 150 cc per hour for 12 hours and monitor I do not suspect any other source of infection and do not think any other etiologies causing this  Sinus tachycardia with V. Tach Likely as result of sepsis physiology Continue broad-spectrum vancomycin and Zosyn I have given IV metoprolol every 6 hours for heart rate and I'll place parameters heart rate above 120 sustained. I have ordered a magnesium which is pending   EKG ordered just shows sinus tachycardia patient has a baseline T wave abnormality which is slightly increased because of rapid rate Troponin has been ordered just in case that might be positive because of sepsis  Infected toe with possible ostial MRI pending I've asked Dr. Dorene Grebe of orthopedics to evaluate the patient on emergently perhaps in the next day or so when his sepsis seems to be under little better control  History of shingles, continue Valtrex at home doses  Mood disorder-continue Cymbalta     Appt with PCP: Requested Code Status: Full code Family Communication: family + and discuss case clearly with them regarding expectations have explained the patient is sick however does not really want ICU status but would need to be monitored little closer  Disposition Plan Pending resolution DVT prophylaxis: continue Lovenox 40 every 12  Consultants: Orthopedics Dr. August Saucer consulted 9/20 but might see the patient on 9/21   Pleas Koch, MD  [facility] Pager [phone] 11/09/2014, 10:39 AM    LOS: 1 day

## 2014-11-09 NOTE — Progress Notes (Signed)
Patient transferred from 5N via bed on tele. Patient oriented to unit and room, instructed on callbell and placed at side. Bed alarm on. Educated on fall risk precautions. No family at bedside. Belongings sent with patient.

## 2014-11-09 NOTE — Progress Notes (Signed)
Noted consult for diabetes coordinator per foot ulcer order set. Attempted to talk with patient regarding diabetes and outpatient regimen for diabetes control. Patient's mother states that patient is not feeling well at this time and he is being transferred to step down unit. Noted patient received initial Novolog correction this morning for fasting glucose of 209 mg/dl. Will continue to follow along while inpatient and make any recommendations as needed as more data is collected. Thanks, Orlando Penner, RN, MSN, CCRN, CDE Diabetes Coordinator Inpatient Diabetes Program [phone] (Team Pager from 8am to 5pm) [phone] ([facility] office) [phone] [facility] office) [phone] [facility] office)

## 2014-11-09 NOTE — Progress Notes (Signed)
VASCULAR LAB PRELIMINARY  PRELIMINARY  PRELIMINARY  PRELIMINARY  Left lower extremity venous duplex  completed.    Preliminary report:  Left:  No evidence of DVT, superficial thrombosis, or Baker's cyst.  Cestone,Helene, RVT 11/09/2014, 3:55 PM

## 2014-11-09 NOTE — Consult Note (Addendum)
WOC wound consult note Reason for Consult: Consult requested for left great toe. X-ray does not indicate osteomyelitis; pt could benefit from MRI. Wound type: Full thickness wound to left plantar toe; .2X.2X1.2cm, bone palpable with swab. Measurement: Dry callous surrounding open wound, 4X4cm Wound bed: Unable to visualize wound bed related to narrow opening. Drainage (amount, consistency, odor) Strong odor and small amt brown drainage Periwound: Generalized edema and erythemia to left great toe. Dressing procedure/placement/frequency: EMR indicates that ortho consult and ABI is pending. Please refer to ortho team for further plan of care.  Please re-consult if further assistance is needed.  Thank-you,  Cammie Mcgee MSN, RN, CWOCN, [place], CNS [phone]

## 2014-11-09 NOTE — Significant Event (Signed)
Rapid Response Event Note  Overview: Time Called: 1110 Arrival Time: 1115 Event Type: Other (Comment)  Initial Focused Assessment: Patient shivering, diaphoretic, in acute distress.  Increased WOB, pale lips.  Restless Very SOB Bp 240/10  ST 120  RR 32  Rectal temp 101.8 Dr Mahala Menghini at bedside  Interventions: Placed on NRB, O2 sats 98%   Lopressor  Ativan PR Tylenol 12 lead EKG done Patient starting to breath easier, becoming more calm. BP 146/79  ST 110  RR 24, skin color pink Weaned back to 2l [place] O2 sat 96% 1215 Patient much improved, able to ambulate to bathroom. Alert and Oriented, in no distress. BP 92/60  ST 102  RR 22 O2 sat 95% on RA  Temp 98.8 Labs drawn Patient transferred to 3S01 via bed  RN to call if assistance needed.   Event Summary: Name of Physician Notified: Dr Mahala Menghini at 1110    at    Outcome: Transferred (Comment) (3s01)     Marcellina Millin

## 2014-11-09 NOTE — Progress Notes (Signed)
UR COMPLETED  

## 2014-11-10 ENCOUNTER — Inpatient Hospital Stay (HOSPITAL_COMMUNITY): Payer: Self-pay

## 2014-11-10 DIAGNOSIS — L089 Local infection of the skin and subcutaneous tissue, unspecified: Secondary | ICD-10-CM | POA: Insufficient documentation

## 2014-11-10 DIAGNOSIS — A419 Sepsis, unspecified organism: Principal | ICD-10-CM

## 2014-11-10 DIAGNOSIS — L03039 Cellulitis of unspecified toe: Secondary | ICD-10-CM

## 2014-11-10 DIAGNOSIS — L03031 Cellulitis of right toe: Secondary | ICD-10-CM

## 2014-11-10 DIAGNOSIS — D61818 Other pancytopenia: Secondary | ICD-10-CM

## 2014-11-10 DIAGNOSIS — L02619 Cutaneous abscess of unspecified foot: Secondary | ICD-10-CM | POA: Insufficient documentation

## 2014-11-10 DIAGNOSIS — E1165 Type 2 diabetes mellitus with hyperglycemia: Secondary | ICD-10-CM

## 2014-11-10 DIAGNOSIS — E1169 Type 2 diabetes mellitus with other specified complication: Secondary | ICD-10-CM | POA: Insufficient documentation

## 2014-11-10 DIAGNOSIS — IMO0001 Reserved for inherently not codable concepts without codable children: Secondary | ICD-10-CM | POA: Insufficient documentation

## 2014-11-10 LAB — BASIC METABOLIC PANEL
ANION GAP: 6 (ref 5–15)
BUN: 10 mg/dL (ref 6–20)
CHLORIDE: 101 mmol/L (ref 101–111)
CO2: 25 mmol/L (ref 22–32)
Calcium: 8.3 mg/dL — ABNORMAL LOW (ref 8.9–10.3)
Creatinine, Ser: 0.86 mg/dL (ref 0.61–1.24)
GFR calc Af Amer: >60 mL/min (ref >60)
GFR calc non Af Amer: >60 mL/min (ref >60)
GLUCOSE: 212 mg/dL — AB (ref 65–99)
POTASSIUM: 3.3 mmol/L — AB (ref 3.5–5.1)
Sodium: 132 mmol/L — ABNORMAL LOW (ref 135–145)

## 2014-11-10 LAB — HEMOGLOBIN A1C
HEMOGLOBIN A1C: 8.1 % — AB (ref 4.8–5.6)
MEAN PLASMA GLUCOSE: 186 mg/dL

## 2014-11-10 LAB — GLUCOSE, CAPILLARY
GLUCOSE-CAPILLARY: 179 mg/dL — AB (ref 65–99)
GLUCOSE-CAPILLARY: 236 mg/dL — AB (ref 65–99)
GLUCOSE-CAPILLARY: 238 mg/dL — AB (ref 65–99)
Glucose-Capillary: 212 mg/dL — ABNORMAL HIGH (ref 65–99)

## 2014-11-10 LAB — TROPONIN I: Troponin I: 0.03 ng/mL (ref <0.031)

## 2014-11-10 LAB — VANCOMYCIN, TROUGH: Vancomycin Tr: 12 ug/mL (ref 10.0–20.0)

## 2014-11-10 MED ORDER — GADOBENATE DIMEGLUMINE 529 MG/ML IV SOLN
20.0000 mL | Freq: Once | INTRAVENOUS | Status: AC | PRN
  Administered 2014-11-10: 20 mL via INTRAVENOUS

## 2014-11-10 MED ORDER — ZOLPIDEM TARTRATE 5 MG PO TABS
5.0000 mg | ORAL_TABLET | Freq: Every evening | ORAL | Status: DC | PRN
  Administered 2014-11-10 – 2014-11-13 (×2): 5 mg via ORAL
  Filled 2014-11-10 (×2): qty 1

## 2014-11-10 MED ORDER — VANCOMYCIN HCL 10 G IV SOLR
1250.0000 mg | Freq: Three times a day (TID) | INTRAVENOUS | Status: DC
  Administered 2014-11-11 – 2014-11-13 (×7): 1250 mg via INTRAVENOUS
  Filled 2014-11-10 (×10): qty 1250

## 2014-11-10 NOTE — Progress Notes (Signed)
Mri - toe abcess dorsal and plantar Will need debridement thurs or fri No sepsis last 24

## 2014-11-10 NOTE — Progress Notes (Signed)
Attempted report 

## 2014-11-10 NOTE — Progress Notes (Signed)
ANTIBIOTIC CONSULT NOTE - FOLLOW UP  Pharmacy Consult for vancomycin Indication: wound infectino / sepsis  No Known Allergies  Patient Measurements: Height:  (188 cm) Weight: 218 lb (98.884 kg) IBW/kg (Calculated) : 82.2 Adjusted Body Weight:   Vital Signs: Temp: 98.2 F (36.8 C) (09/21 1939) Temp Source: Oral (09/21 1939) BP: 117/69 mmHg (09/21 1939) Pulse Rate: 79 (09/21 1939) Intake/Output from previous day: 09/20 0701 - 09/21 0700 In: 5430 [P.O.:1080; I.V.:2400; IV Piggyback:1950] Out: 1675 [Urine:1675] Intake/Output from this shift: Total I/O In: 3 [I.V.:3] Out: 500 [Urine:500]  Labs:  Recent Labs  11/08/14 1225 11/09/14 0600 11/09/14 1235 11/10/14 1422  WBC 9.3 12.2* 3.6*  --   HGB 13.5 11.3* 12.3*  --   PLT 168 142* 140*  --   CREATININE 0.89 1.07  --  0.86   Estimated Creatinine Clearance: 123.5 mL/min (by C-G formula based on Cr of 0.86).  Recent Labs  11/10/14 1928  VANCOTROUGH 12     Microbiology: Recent Results (from the past 720 hour(s))  Blood Culture (routine x 2)     Status: None (Preliminary result)   Collection Time: 11/08/14 12:23 PM  Result Value Ref Range Status   Specimen Description BLOOD LEFT FOREARM  Final   Special Requests BOTTLES DRAWN AEROBIC AND ANAEROBIC 10CC  Final   Culture  Setup Time   Final    GRAM POSITIVE COCCI IN CHAINS IN BOTH AEROBIC AND ANAEROBIC BOTTLES CRITICAL RESULT CALLED TO, READ BACK BY AND VERIFIED WITH: Jacques Navy  11/09/14 MKELLY    Culture   Final    VIRIDANS STREPTOCOCCUS THE SIGNIFICANCE OF ISOLATING THIS ORGANISM FROM A SINGLE SET OF BLOOD CULTURES WHEN MULTIPLE SETS ARE DRAWN IS UNCERTAIN. PLEASE NOTIFY THE MICROBIOLOGY DEPARTMENT WITHIN ONE WEEK IF SPECIATION AND SENSITIVITIES ARE REQUIRED.    Report Status PENDING  Incomplete  Urine culture     Status: None   Collection Time: 11/08/14  6:55 PM  Result Value Ref Range Status   Specimen Description URINE, CLEAN CATCH  Final   Special  Requests NONE  Final   Culture NO GROWTH 1 DAY  Final   Report Status 11/09/2014 FINAL  Final  Blood Culture (routine x 2)     Status: None (Preliminary result)   Collection Time: 11/08/14 10:14 PM  Result Value Ref Range Status   Specimen Description BLOOD RIGHT FOREARM  Final   Special Requests BOTTLES DRAWN AEROBIC AND ANAEROBIC 10CC  Final   Culture NO GROWTH 2 DAYS  Final   Report Status PENDING  Incomplete  Culture, blood (routine x 2)     Status: None (Preliminary result)   Collection Time: 11/09/14 12:35 PM  Result Value Ref Range Status   Specimen Description BLOOD RIGHT ARM  Final   Special Requests BOTTLES DRAWN AEROBIC AND ANAEROBIC 7CC  Final   Culture NO GROWTH < 24 HOURS  Final   Report Status PENDING  Incomplete  Culture, blood (routine x 2)     Status: None (Preliminary result)   Collection Time: 11/09/14 12:45 PM  Result Value Ref Range Status   Specimen Description BLOOD RIGHT HAND  Final   Special Requests BOTTLES DRAWN AEROBIC ONLY  3CC  Final   Culture NO GROWTH < 24 HOURS  Final   Report Status PENDING  Incomplete  MRSA PCR Screening     Status: None   Collection Time: 11/09/14  2:13 PM  Result Value Ref Range Status   MRSA by PCR NEGATIVE NEGATIVE  Final    Comment:        The GeneXpert MRSA Assay (FDA approved for NASAL specimens only), is one component of a comprehensive MRSA colonization surveillance program. It is not intended to diagnose MRSA infection nor to guide or monitor treatment for MRSA infections.     Anti-infectives    Start     Dose/Rate Route Frequency Ordered Stop   11/09/14 1000  valACYclovir (VALTREX) tablet 500 mg     500 mg Oral Daily 11/08/14 2302     11/09/14 0400  vancomycin (VANCOCIN) IVPB 1000 mg/200 mL premix     1,000 mg 200 mL/hr over 60 Minutes Intravenous Every 8 hours 11/08/14 1947     11/09/14 0200  piperacillin-tazobactam (ZOSYN) IVPB 3.375 g     3.375 g 12.5 mL/hr over 240 Minutes Intravenous 3 times per day  11/08/14 1947     11/08/14 2315  cefTRIAXone (ROCEPHIN) 2 g in dextrose 5 % 50 mL IVPB  Status:  Discontinued     2 g 100 mL/hr over 30 Minutes Intravenous Every 24 hours 11/08/14 2302 11/08/14 2302   11/08/14 2315  metroNIDAZOLE (FLAGYL) IVPB 500 mg  Status:  Discontinued     500 mg 100 mL/hr over 60 Minutes Intravenous Every 8 hours 11/08/14 2302 11/08/14 2302   11/08/14 1945  piperacillin-tazobactam (ZOSYN) IVPB 3.375 g     3.375 g 100 mL/hr over 30 Minutes Intravenous  Once 11/08/14 1930 11/08/14 2025   11/08/14 1930  vancomycin (VANCOCIN) 1,750 mg in sodium chloride 0.9 % 500 mL IVPB     1,750 mg 250 mL/hr over 120 Minutes Intravenous  Once 11/08/14 1930 11/08/14 2220      Assessment: 54 yo male with wound infection / sepsis is currently on subtherapeutic vancomycin. Vancomycin trough is 12.   Goal of Therapy:  Vancomycin trough level 15-20 mcg/ml  Plan:  - increase vancomycin to 1250 mg iv q12h, 1st dose tomorrow at 0400 - recheck vancomycin trough at steady state - monitor renal function  So, Tsz-Yin 11/10/2014,8:45 PM

## 2014-11-10 NOTE — Progress Notes (Signed)
Attempted report X2 

## 2014-11-10 NOTE — Progress Notes (Signed)
PROGRESS NOTE  CONO GEBHARD FUX:[id number] DOB: [birth year] DOA: 11/08/2014 PCP:  Duane Lope, MD  Summary: 54 year old man with history of diabetes mellitus with neuropathy presented with left toe infection  Assessment/Plan: 1. Sepsis secondary to diabetic foot ulcer. Afebrile >24 hours, hemodynamics stable. 2. Diabetes mellitus type 2 with diabetic nephropathy. Stable. Hemoglobin A1c 8.1. 3. Tobacco use disorder 4. Hypothyroidism.  5. Pancytopenia. HIV nonreactive. TSH within normal limits. Suspect secondary sepsis.   Appears stable, sepsis appears resolved.   Continue empiric antibiotics, follow-up MRI, management per orthopedics  Transfer to medical floor  CBC and BMP in AM  Code Status: full code DVT prophylaxis: Lovenox Family Communication: none present. Patient alert, understands recommendations. Disposition Plan: home  Brendia Sacks, MD  [facility]  Pager [phone] If 7PM-7AM, please contact night-coverage at [email or web address], password [facility] 11/10/2014, 10:59 AM  LOS: 2 days   Consultants: Orthopedics   Procedures:    Antibiotics: Zosyn 9/19  >> Vancomycin 9/19  >>  HPI/Subjective: Rapid response yesterday with onset of sepsis and transferred to stepdown. Seen by orthopedics with plan for MRI  Has a headache and some right leg pain.  Objective: Filed Vitals:   11/10/14 0318 11/10/14 0514 11/10/14 0800 11/10/14 0839  BP: 118/74  116/71   Pulse: 84     Temp: 99.7 F (37.6 C)   98.6 F (37 C)  TempSrc: Oral   Oral  Resp: 32     Height:  (1.88 m)     Weight: 98.884 kg (218 lb)     SpO2: 93% 90%      Intake/Output Summary (Last 24 hours) at 11/10/14 1059 Last data filed at 11/10/14 0841  Gross per 24 hour  Intake   5430 ml  Output   2275 ml  Net   3155 ml     Filed Weights   11/08/14 1215 11/09/14 0500 11/10/14 0318  Weight: 93.441 kg (206 lb) 96.3 kg (212 lb 4.9 oz) 98.884 kg (218 lb)    Exam:     Afebrile,  normotensive General:  Appears calm and comfortable Cardiovascular: RRR, no m/r/g. No RLE edema. 2+ LLE edema. Telemetry: SR, no arrhythmias  Respiratory: CTA bilaterally, no w/r/r. Normal respiratory effort. Skin: right great toe bandaged. Some erythema proximal to bandage extending to distal foot.  Psychiatric: grossly normal mood and affect, speech fluent and appropriate  New data reviewed:  UOP 1675  CBG stable  Troponins negative  Lactic acid last check was 1.3  Pertinent data since admission:    Pending data:    Scheduled Meds: . DULoxetine  60 mg Oral Daily  . enoxaparin (LOVENOX) injection  40 mg Subcutaneous Q24H  . feeding supplement (GLUCERNA SHAKE)  237 mL Oral TID BM  . gabapentin  800 mg Oral TID  . insulin aspart  0-15 Units Subcutaneous TID WC  . insulin aspart  0-5 Units Subcutaneous QHS  . levothyroxine  175 mcg Oral QAC breakfast  . metoprolol  2.5 mg Intravenous 4 times per day  . piperacillin-tazobactam (ZOSYN)  IV  3.375 g Intravenous 3 times per day  . sodium chloride  3 mL Intravenous Q12H  . valACYclovir  500 mg Oral Daily  . vancomycin  1,000 mg Intravenous Q8H   Continuous Infusions: . sodium chloride 150 mL/hr at 11/09/14 1900    Principal Problem:   Diabetic foot infection Active Problems:   Cellulitis   Hypothyroidism   History of shingles   Mood disorder   Type 2 diabetes  mellitus, uncontrolled   Sepsis   Other pancytopenia   Time spent 20 minutes

## 2014-11-11 ENCOUNTER — Inpatient Hospital Stay (HOSPITAL_COMMUNITY): Payer: Self-pay | Admitting: Anesthesiology

## 2014-11-11 ENCOUNTER — Encounter (HOSPITAL_COMMUNITY)
Admission: EM | Disposition: A | Discharge status: Home Health | Payer: Self-pay | Source: Home / Self Care | Attending: Internal Medicine

## 2014-11-11 ENCOUNTER — Inpatient Hospital Stay (HOSPITAL_COMMUNITY): Payer: MEDICAID | Admitting: Anesthesiology

## 2014-11-11 DIAGNOSIS — L03032 Cellulitis of left toe: Secondary | ICD-10-CM

## 2014-11-11 DIAGNOSIS — L02612 Cutaneous abscess of left foot: Secondary | ICD-10-CM

## 2014-11-11 HISTORY — PX: AMPUTATION: SHX166

## 2014-11-11 LAB — CBC
HEMATOCRIT: 29.8 % — AB (ref 39.0–52.0)
HEMOGLOBIN: 10.8 g/dL — AB (ref 13.0–17.0)
MCH: 31.2 pg (ref 26.0–34.0)
MCHC: 36.2 g/dL — ABNORMAL HIGH (ref 30.0–36.0)
MCV: 86.1 fL (ref 78.0–100.0)
Platelets: 147 10*3/uL — ABNORMAL LOW (ref 150–400)
RBC: 3.46 MIL/uL — AB (ref 4.22–5.81)
RDW: 12.1 % (ref 11.5–15.5)
WBC: 7.6 10*3/uL (ref 4.0–10.5)

## 2014-11-11 LAB — BASIC METABOLIC PANEL
Anion gap: 6 (ref 5–15)
BUN: 8 mg/dL (ref 6–20)
CHLORIDE: 103 mmol/L (ref 101–111)
CO2: 26 mmol/L (ref 22–32)
Calcium: 8.7 mg/dL — ABNORMAL LOW (ref 8.9–10.3)
Creatinine, Ser: 0.72 mg/dL (ref 0.61–1.24)
GFR calc non Af Amer: >60 mL/min (ref >60)
Glucose, Bld: 194 mg/dL — ABNORMAL HIGH (ref 65–99)
POTASSIUM: 3.4 mmol/L — AB (ref 3.5–5.1)
SODIUM: 135 mmol/L (ref 135–145)

## 2014-11-11 LAB — GLUCOSE, CAPILLARY
GLUCOSE-CAPILLARY: 179 mg/dL — AB (ref 65–99)
GLUCOSE-CAPILLARY: 168 mg/dL — AB (ref 65–99)
Glucose-Capillary: 141 mg/dL — ABNORMAL HIGH (ref 65–99)
Glucose-Capillary: 142 mg/dL — ABNORMAL HIGH (ref 65–99)

## 2014-11-11 LAB — MAGNESIUM: Magnesium: 1.6 mg/dL — ABNORMAL LOW (ref 1.7–2.4)

## 2014-11-11 SURGERY — AMPUTATION DIGIT
Anesthesia: General | Site: Foot | Laterality: Left

## 2014-11-11 MED ORDER — SODIUM CHLORIDE 0.9 % IV SOLN
INTRAVENOUS | Status: DC | PRN
  Administered 2014-11-11: 23:00:00 via INTRAVENOUS

## 2014-11-11 MED ORDER — POTASSIUM CHLORIDE CRYS ER 20 MEQ PO TBCR
40.0000 meq | EXTENDED_RELEASE_TABLET | Freq: Once | ORAL | Status: AC
  Administered 2014-11-11: 40 meq via ORAL
  Filled 2014-11-11: qty 2

## 2014-11-11 MED ORDER — ONDANSETRON HCL 4 MG/2ML IJ SOLN
INTRAMUSCULAR | Status: AC
  Filled 2014-11-11: qty 2

## 2014-11-11 MED ORDER — FENTANYL CITRATE (PF) 250 MCG/5ML IJ SOLN
INTRAMUSCULAR | Status: AC
  Filled 2014-11-11: qty 5

## 2014-11-11 MED ORDER — LIDOCAINE HCL (CARDIAC) 20 MG/ML IV SOLN
INTRAVENOUS | Status: AC
  Filled 2014-11-11: qty 5

## 2014-11-11 MED ORDER — PROPOFOL 10 MG/ML IV BOLUS
INTRAVENOUS | Status: AC
  Filled 2014-11-11: qty 20

## 2014-11-11 MED ORDER — MIDAZOLAM HCL 2 MG/2ML IJ SOLN
INTRAMUSCULAR | Status: AC
  Filled 2014-11-11: qty 4

## 2014-11-11 MED ORDER — MIDAZOLAM HCL 2 MG/2ML IJ SOLN
INTRAMUSCULAR | Status: DC | PRN
  Administered 2014-11-11: 2 mg via INTRAVENOUS

## 2014-11-11 SURGICAL SUPPLY — 58 items
APL SKNCLS STERI-STRIP NONHPOA (GAUZE/BANDAGES/DRESSINGS)
BANDAGE ELASTIC 4 VELCRO ST LF (GAUZE/BANDAGES/DRESSINGS) ×2 IMPLANT
BENZOIN TINCTURE PRP APPL 2/3 (GAUZE/BANDAGES/DRESSINGS) ×1 IMPLANT
BLADE SURG ROTATE 9660 (MISCELLANEOUS) IMPLANT
BNDG CMPR 9X4 STRL LF SNTH (GAUZE/BANDAGES/DRESSINGS)
BNDG CMPR MD 5X2 ELC HKLP STRL (GAUZE/BANDAGES/DRESSINGS) ×1
BNDG COHESIVE 1X5 TAN STRL LF (GAUZE/BANDAGES/DRESSINGS) IMPLANT
BNDG CONFORM 2 STRL LF (GAUZE/BANDAGES/DRESSINGS) ×2 IMPLANT
BNDG CONFORM 3 STRL LF (GAUZE/BANDAGES/DRESSINGS) IMPLANT
BNDG ELASTIC 2 VLCR STRL LF (GAUZE/BANDAGES/DRESSINGS) ×2 IMPLANT
BNDG ESMARK 4X9 LF (GAUZE/BANDAGES/DRESSINGS) IMPLANT
CLOSURE WOUND 1/2 X4 (GAUZE/BANDAGES/DRESSINGS) ×1
CORDS BIPOLAR (ELECTRODE) ×3 IMPLANT
COVER SURGICAL LIGHT HANDLE (MISCELLANEOUS) ×3 IMPLANT
CUFF TOURNIQUET SINGLE 18IN (TOURNIQUET CUFF) IMPLANT
CUFF TOURNIQUET SINGLE 24IN (TOURNIQUET CUFF) IMPLANT
CUFF TOURNIQUET SINGLE 34IN LL (TOURNIQUET CUFF) IMPLANT
CUFF TOURNIQUET SINGLE 44IN (TOURNIQUET CUFF) IMPLANT
DRAPE INCISE IOBAN 66X45 STRL (DRAPES) IMPLANT
DRAPE OEC MINIVIEW 54X84 (DRAPES) IMPLANT
DRAPE U-SHAPE 47X51 STRL (DRAPES) IMPLANT
DRSG EMULSION OIL 3X3 NADH (GAUZE/BANDAGES/DRESSINGS) IMPLANT
DURAPREP 26ML APPLICATOR (WOUND CARE) ×1 IMPLANT
ELECT REM PT RETURN 9FT ADLT (ELECTROSURGICAL) ×3
ELECTRODE REM PT RTRN 9FT ADLT (ELECTROSURGICAL) IMPLANT
GAUZE PACKING IODOFORM 1/4X15 (GAUZE/BANDAGES/DRESSINGS) ×2 IMPLANT
GAUZE SPONGE 2X2 8PLY STRL LF (GAUZE/BANDAGES/DRESSINGS) IMPLANT
GAUZE SPONGE 4X4 12PLY STRL (GAUZE/BANDAGES/DRESSINGS) ×2 IMPLANT
GAUZE XEROFORM 1X8 LF (GAUZE/BANDAGES/DRESSINGS) ×2 IMPLANT
GLOVE BIOGEL PI IND STRL 8 (GLOVE) ×1 IMPLANT
GLOVE BIOGEL PI INDICATOR 8 (GLOVE) ×2
GLOVE SURG ORTHO 8.0 STRL STRW (GLOVE) ×3 IMPLANT
GOWN STRL REUS W/ TWL LRG LVL3 (GOWN DISPOSABLE) ×2 IMPLANT
GOWN STRL REUS W/TWL LRG LVL3 (GOWN DISPOSABLE) ×6
KIT BASIN OR (CUSTOM PROCEDURE TRAY) ×3 IMPLANT
KIT ROOM TURNOVER OR (KITS) ×3 IMPLANT
MANIFOLD NEPTUNE II (INSTRUMENTS) ×3 IMPLANT
NS IRRIG 1000ML POUR BTL (IV SOLUTION) ×3 IMPLANT
PACK ORTHO EXTREMITY (CUSTOM PROCEDURE TRAY) ×3 IMPLANT
PAD ARMBOARD 7.5X6 YLW CONV (MISCELLANEOUS) ×6 IMPLANT
SPECIMEN JAR SMALL (MISCELLANEOUS) ×3 IMPLANT
SPONGE GAUZE 2X2 STER 10/PKG (GAUZE/BANDAGES/DRESSINGS)
STRIP CLOSURE SKIN 1/2X4 (GAUZE/BANDAGES/DRESSINGS) ×2 IMPLANT
SUCTION FRAZIER TIP 10 FR DISP (SUCTIONS) ×3 IMPLANT
SUT ETHIBOND 4 0 TF (SUTURE) IMPLANT
SUT ETHIBOND 5 0 P 3 (SUTURE)
SUT ETHILON 4 0 P 3 18 (SUTURE) IMPLANT
SUT ETHILON 5 0 P 3 18 (SUTURE)
SUT NYLON ETHILON 5-0 P-3 1X18 (SUTURE) IMPLANT
SUT POLY ETHIBOND 5-0 P-3 1X18 (SUTURE) IMPLANT
SUT PROLENE 4 0 P 3 18 (SUTURE) IMPLANT
SUT SILK 4 0 PS 2 (SUTURE) IMPLANT
SUT VIC AB 3-0 FS2 27 (SUTURE) IMPLANT
TOWEL OR 17X24 6PK STRL BLUE (TOWEL DISPOSABLE) ×3 IMPLANT
TOWEL OR 17X26 10 PK STRL BLUE (TOWEL DISPOSABLE) ×3 IMPLANT
TUBE CONNECTING 12'X1/4 (SUCTIONS) ×1
TUBE CONNECTING 12X1/4 (SUCTIONS) ×2 IMPLANT
WATER STERILE IRR 1000ML POUR (IV SOLUTION) ×3 IMPLANT

## 2014-11-11 NOTE — Progress Notes (Signed)
Inpatient Diabetes Program Recommendations  AACE/ADA: New Consensus Statement on Inpatient Glycemic Control (2015)  Target Ranges:  Prepandial:   less than 140 mg/dL      Peak postprandial:   less than 180 mg/dL (1-2 hours)      Critically ill patients:  140 - 180 mg/dL   Results for WINN, MUEHL (MRN [id number]) as of 11/11/2014 13:37  Ref. Range 11/10/2014 08:37 11/10/2014 12:40 11/10/2014 16:59 11/10/2014 21:28 11/11/2014 06:50  Glucose-Capillary Latest Ref Range: 65-99 mg/dL 644 (H) 034 (H) 742 (H) 238 (H) 179 (H)   Review of Glycemic Control  Diabetes history: DM 2 Outpatient Diabetes medications: Amaryl 2 mg Daily, Metformin 1,000mg  BID Current orders for Inpatient glycemic control: Novolog Moderate TID + HS scale  Inpatient Diabetes Program Recommendations: Correction (SSI): Glucose still mostly in the 200's. Please consider increasing correction to Novolog Resistant TID.   Thanks,  Christena Deem RN, MSN, [facility] Inpatient Diabetes Coordinator Team Pager [phone] (8a-5p)

## 2014-11-11 NOTE — Progress Notes (Signed)
[facility]                                                                              Patient Demographics  Darren Skinner, is a 54 y.o. male, DOB - 1960-05-12, ZOX:[id number]  Admit date - 11/08/2014   Admitting Physician Rolly Salter, MD  Outpatient Primary MD for the patient is  Duane Lope, MD  LOS - 3   Chief Complaint  Patient presents with  . Wound Infection      HPI on 11/08/2014 by Dr. Lynden Oxford REILY ILIC is a 54 y.o. male with Past medical history of hypothyroidism, diabetes mellitus with neuropathy, mood disorder. The patient is presenting with complaints of infection of the left toe. Patient mentions that he he has chronic callus which she picks on and off. One month ago he picked his callus on the left foot and later on he started having redness which was followed by increasing swelling. This was followed by increasing pain while emulating over last 1 week. Therefore he decided to go to see his PCP. In the office he was told that he may have to have his toe amputated and related to go to ER for further workup. Despite treatment at home the patient's pain was not improving and therefore he decided to come to the hospital. He denies any fall trauma or injury denies having any fever or chills no chest pain and abdominal pain. He complains of leg tenderness in the left leg. He denies having any burning urination diarrhea or constipation. The patient is coming from home And is independent for most of his ADL; manages his medication on his own.  Assessment & Plan   Sepsis secondary to left diabetic foot ulcer/abscess -Patient has been afebrile for the past 48 hours -MRI shows sialitis of the first great toe with soft tissue ulceration along the medial aspect, no osteomyelitis -Orthopedic surgery consulted and appreciated, plan for I&D today -Continue vancomycin and Zosyn  Diabetes mellitus, type II with neuropathy -Hemoglobin A1c 8.1 -Continue insulin sliding scale,  CBG monitoring -Continue gabapentin  Tobacco use disorder -Counseled regarding smoking cessation  Pancytopenia -TSH within normal limits, HIV nonreactive -Improving, likely secondary to sepsis -Continue to monitor CBC  Hypokalemia -Will replace and continue to monitor BMP  Hypothyroidism -Continue Synthroid  Code Status: Full  Family Communication: None bedside  Disposition Plan: Admitted, pending I&D by orthopedic surgery today  Time Spent in minutes   30 minutes  Procedures  I&D   Consults   Orthopedic surgery  DVT Prophylaxis  Lovenox  Lab Results  Component Value Date   PLT 147* 11/11/2014    Medications  Scheduled Meds: . DULoxetine  60 mg Oral Daily  . enoxaparin (LOVENOX) injection  40 mg Subcutaneous Q24H  . feeding supplement (GLUCERNA SHAKE)  237 mL Oral TID BM  . gabapentin  800 mg Oral TID  . insulin aspart  0-15 Units Subcutaneous TID WC  . insulin aspart  0-5 Units Subcutaneous QHS  . levothyroxine  175 mcg Oral QAC breakfast  . piperacillin-tazobactam (ZOSYN)  IV  3.375 g Intravenous 3 times per day  . sodium chloride  3 mL Intravenous Q12H  .  valACYclovir  500 mg Oral Daily  . vancomycin  1,250 mg Intravenous Q8H   Continuous Infusions:  PRN Meds:.acetaminophen **OR** acetaminophen, fentaNYL (SUBLIMAZE) injection, HYDROcodone-acetaminophen, zolpidem  Antibiotics    Anti-infectives    Start     Dose/Rate Route Frequency Ordered Stop   11/11/14 0400  vancomycin (VANCOCIN) 1,250 mg in sodium chloride 0.9 % 250 mL IVPB     1,250 mg 166.7 mL/hr over 90 Minutes Intravenous Every 8 hours 11/10/14 2046     11/09/14 1000  valACYclovir (VALTREX) tablet 500 mg     500 mg Oral Daily 11/08/14 2302     11/09/14 0400  vancomycin (VANCOCIN) IVPB 1000 mg/200 mL premix  Status:  Discontinued     1,000 mg 200 mL/hr over 60 Minutes Intravenous Every 8 hours 11/08/14 1947 11/10/14 2046   11/09/14 0200  piperacillin-tazobactam (ZOSYN) IVPB 3.375 g      3.375 g 12.5 mL/hr over 240 Minutes Intravenous 3 times per day 11/08/14 1947     11/08/14 2315  cefTRIAXone (ROCEPHIN) 2 g in dextrose 5 % 50 mL IVPB  Status:  Discontinued     2 g 100 mL/hr over 30 Minutes Intravenous Every 24 hours 11/08/14 2302 11/08/14 2302   11/08/14 2315  metroNIDAZOLE (FLAGYL) IVPB 500 mg  Status:  Discontinued     500 mg 100 mL/hr over 60 Minutes Intravenous Every 8 hours 11/08/14 2302 11/08/14 2302   11/08/14 1945  piperacillin-tazobactam (ZOSYN) IVPB 3.375 g     3.375 g 100 mL/hr over 30 Minutes Intravenous  Once 11/08/14 1930 11/08/14 2025   11/08/14 1930  vancomycin (VANCOCIN) 1,750 mg in sodium chloride 0.9 % 500 mL IVPB     1,750 mg 250 mL/hr over 120 Minutes Intravenous  Once 11/08/14 1930 11/08/14 2220        Subjective:   Darren Skinner seen and examined today.  Patient currently denies any chest pain, shortness breath, abdominal pain. Does continue to complain of leg pain. Patient also complains of dry eyes.  Objective:   Filed Vitals:   11/10/14 1939 11/10/14 2254 11/11/14 0500 11/11/14 0535  BP: 117/69 112/74  137/78  Pulse: 79 66  85  Temp: 98.2 F (36.8 C) 97.8 F (36.6 C)  98.7 F (37.1 C)  TempSrc: Oral Oral  Oral  Resp: 14 17  17   Height:      Weight:   97.7 kg (215 lb 6.2 oz)   SpO2: 100% 94%  93%    Wt Readings from Last 3 Encounters:  11/11/14 97.7 kg (215 lb 6.2 oz)  10/01/14 89.812 kg (198 lb)  04/11/11 101.696 kg (224 lb 3.2 oz)     Intake/Output Summary (Last 24 hours) at 11/11/14 1325 Last data filed at 11/11/14 1222  Gross per 24 hour  Intake    243 ml  Output   2750 ml  Net  -2507 ml    Exam  General: Well developed, well nourished, NAD, appears stated age  HEENT: NCAT,  mucous membranes moist.   Cardiovascular: S1 S2 auscultated, no rubs, murmurs or gallops. Regular rate and rhythm.  Respiratory: Clear to auscultation bilaterally with equal chest rise  Abdomen: Soft, nontender, nondistended, + bowel  sounds  Extremities: warm dry without cyanosis clubbing. LLE edema, bandage on great toe, erythema noted on the left foot.  Neuro: AAOx3, nonfocal  Psych: Normal affect and demeanor with intact judgement and insight  Data Review   Micro Results Recent Results (from the past 240 hour(s))  Blood Culture (routine x 2)     Status: None (Preliminary result)   Collection Time: 11/08/14 12:23 PM  Result Value Ref Range Status   Specimen Description BLOOD LEFT FOREARM  Final   Special Requests BOTTLES DRAWN AEROBIC AND ANAEROBIC 10CC  Final   Culture  Setup Time   Final    GRAM POSITIVE COCCI IN CHAINS IN BOTH AEROBIC AND ANAEROBIC BOTTLES CRITICAL RESULT CALLED TO, READ BACK BY AND VERIFIED WITH: Jacques Navy  11/09/14 MKELLY    Culture   Final    VIRIDANS STREPTOCOCCUS THE SIGNIFICANCE OF ISOLATING THIS ORGANISM FROM A SINGLE SET OF BLOOD CULTURES WHEN MULTIPLE SETS ARE DRAWN IS UNCERTAIN. PLEASE NOTIFY THE MICROBIOLOGY DEPARTMENT WITHIN ONE WEEK IF SPECIATION AND SENSITIVITIES ARE REQUIRED.    Report Status PENDING  Incomplete  Urine culture     Status: None   Collection Time: 11/08/14  6:55 PM  Result Value Ref Range Status   Specimen Description URINE, CLEAN CATCH  Final   Special Requests NONE  Final   Culture NO GROWTH 1 DAY  Final   Report Status 11/09/2014 FINAL  Final  Blood Culture (routine x 2)     Status: None (Preliminary result)   Collection Time: 11/08/14 10:14 PM  Result Value Ref Range Status   Specimen Description BLOOD RIGHT FOREARM  Final   Special Requests BOTTLES DRAWN AEROBIC AND ANAEROBIC 10CC  Final   Culture NO GROWTH 2 DAYS  Final   Report Status PENDING  Incomplete  Culture, blood (routine x 2)     Status: None (Preliminary result)   Collection Time: 11/09/14 12:35 PM  Result Value Ref Range Status   Specimen Description BLOOD RIGHT ARM  Final   Special Requests BOTTLES DRAWN AEROBIC AND ANAEROBIC 7CC  Final   Culture NO GROWTH < 24 HOURS  Final    Report Status PENDING  Incomplete  Culture, blood (routine x 2)     Status: None (Preliminary result)   Collection Time: 11/09/14 12:45 PM  Result Value Ref Range Status   Specimen Description BLOOD RIGHT HAND  Final   Special Requests BOTTLES DRAWN AEROBIC ONLY  3CC  Final   Culture NO GROWTH < 24 HOURS  Final   Report Status PENDING  Incomplete  MRSA PCR Screening     Status: None   Collection Time: 11/09/14  2:13 PM  Result Value Ref Range Status   MRSA by PCR NEGATIVE NEGATIVE Final    Comment:        The GeneXpert MRSA Assay (FDA approved for NASAL specimens only), is one component of a comprehensive MRSA colonization surveillance program. It is not intended to diagnose MRSA infection nor to guide or monitor treatment for MRSA infections.     Radiology Reports Mr Foot Left W Wo Contrast  11/10/2014   CLINICAL DATA:  Toe infection, ulceration, drainage. History diabetes.  EXAM: MRI OF THE LEFT FOREFOOT WITHOUT AND WITH CONTRAST  TECHNIQUE: Multiplanar, multisequence MR imaging was performed both before and after administration of intravenous contrast.  CONTRAST:  20 mL MultiHance  COMPARISON:  None.  FINDINGS: There is soft tissue ulceration over the medial aspect of the first distal phalanx with surrounding soft tissue edema and enhancement. 2.5 x 1 cm fluid collection along the plantar aspect of the first distal phalanx consistent with an abscess. 6 mm fluid collection along the dorsal aspect of the great toe likely representing a small abscess. There is no significant marrow signal abnormality. There is  no cortical destruction. There is no joint effusion.  There is no other marrow signal abnormality. There is no acute fracture or dislocation. There is mild osteoarthritis of the first MTP joint.  IMPRESSION: 1. Cellulitis of the first great toe with soft tissue ulceration along the medial aspect. No evidence of osteomyelitis of the first great toe. 2.5 x 1 cm abscess along the  plantar aspect of the first distal phalanx. 6 mm abscess along the dorsal aspect of the great toe.   Electronically Signed   By: Elige Ko   On: 11/10/2014 13:01   Dg Toe Great Left  11/08/2014   CLINICAL DATA:  54 year old with diabetic ulcer involving the left great toe which began approximately 2 months ago.  EXAM: LEFT GREAT TOE 3 VIEWS  COMPARISON:  None.  FINDINGS: Marked diffuse soft tissue swelling. Ulceration involving the plantar surface. No evidence of osteomyelitis. No evidence of acute or subacute fracture or dislocation. Well preserved joint spaces. Well preserved bone mineral density.  IMPRESSION: Soft tissue swelling and ulceration.  No osseous abnormality.   Electronically Signed   By: Hulan Saas M.D.   On: 11/08/2014 19:52    CBC  Recent Labs Lab 11/08/14 1225 11/09/14 0600 11/09/14 1235 11/11/14 0417  WBC 9.3 12.2* 3.6* 7.6  HGB 13.5 11.3* 12.3* 10.8*  HCT 38.5* 33.0* 35.3* 29.8*  PLT 168 142* 140* 147*  MCV 88.5 88.9 88.9 86.1  MCH 31.0 30.5 31.0 31.2  MCHC 35.1 34.2 34.8 36.2*  RDW 12.0 12.1 12.2 12.1  LYMPHSABS 0.6* 0.6*  --   --   MONOABS 0.2 0.5  --   --   EOSABS 0.1 0.0  --   --   BASOSABS 0.0 0.0  --   --     Chemistries   Recent Labs Lab 11/08/14 1225 11/09/14 0600 11/09/14 1235 11/10/14 1422 11/11/14 0417  NA 139 134*  --  132* 135  K 4.0 3.4*  --  3.3* 3.4*  CL 104 101  --  101 103  CO2 23 24  --  25 26  GLUCOSE 149* 227*  --  212* 194*  BUN 18 14  --  10 8  CREATININE 0.89 1.07  --  0.86 0.72  CALCIUM 9.8 8.4*  --  8.3* 8.7*  MG  --   --  1.4*  --  1.6*  AST 19 32  --   --   --   ALT 18 23  --   --   --   ALKPHOS 70 68  --   --   --   BILITOT 0.9 1.6*  --   --   --    ------------------------------------------------------------------------------------------------------------------ estimated creatinine clearance is 122.7 mL/min (by C-G formula based on Cr of  0.72). ------------------------------------------------------------------------------------------------------------------  Recent Labs  11/09/14 0042  HGBA1C 8.1*   ------------------------------------------------------------------------------------------------------------------ No results for input(s): CHOL, HDL, LDLCALC, TRIG, CHOLHDL, LDLDIRECT in the last 72 hours. ------------------------------------------------------------------------------------------------------------------  Recent Labs  11/09/14 0042  TSH 0.371   ------------------------------------------------------------------------------------------------------------------ No results for input(s): VITAMINB12, FOLATE, FERRITIN, TIBC, IRON, RETICCTPCT in the last 72 hours.  Coagulation profile No results for input(s): INR, PROTIME in the last 168 hours.  No results for input(s): DDIMER in the last 72 hours.  Cardiac Enzymes  Recent Labs Lab 11/09/14 1630 11/09/14 2215 11/10/14 0508  TROPONINI 0.04* <0.03 0.03   ------------------------------------------------------------------------------------------------------------------ Invalid input(s): POCBNP    MIKHAIL, MARYANN D.O. on 11/11/2014 at 1:25 PM  Between 7am to 7pm - Pager -  [phone]  After 7pm go to [email or web address] - password [email or web address]  And look for the night coverage person covering for me after hours  [facility] Office  [phone]

## 2014-11-11 NOTE — Anesthesia Preprocedure Evaluation (Addendum)
Anesthesia Evaluation  Patient identified by MRN, date of birth, ID band Patient awake    Reviewed: Allergy & Precautions, NPO status , Patient's Chart, lab work & pertinent test results  Airway Mallampati: I  TM Distance: >3 FB Neck ROM: Full    Dental  (+) Teeth Intact, Dental Advisory Given   Pulmonary Current Smoker,    breath sounds clear to auscultation       Cardiovascular  Rhythm:Regular Rate:Normal     Neuro/Psych    GI/Hepatic   Endo/Other  diabetes, Well Controlled, Type 2, Insulin Dependent, Oral Hypoglycemic Agents  Renal/GU      Musculoskeletal   Abdominal   Peds  Hematology   Anesthesia Other Findings   Reproductive/Obstetrics                            Anesthesia Physical Anesthesia Plan  ASA: III  Anesthesia Plan: General   Post-op Pain Management:    Induction: Intravenous  Airway Management Planned:   Additional Equipment:   Intra-op Plan:   Post-operative Plan: Extubation in OR  Informed Consent: I have reviewed the patients History and Physical, chart, labs and discussed the procedure including the risks, benefits and alternatives for the proposed anesthesia with the patient or authorized representative who has indicated his/her understanding and acceptance.   Dental advisory given  Plan Discussed with: Anesthesiologist, Surgeon and CRNA  Anesthesia Plan Comments:         Anesthesia Quick Evaluation

## 2014-11-11 NOTE — Progress Notes (Signed)
Toe infection l 1 Plan I and D today this pm Npo after 1200 noon

## 2014-11-12 ENCOUNTER — Encounter (HOSPITAL_COMMUNITY): Payer: Self-pay | Admitting: Orthopedic Surgery

## 2014-11-12 DIAGNOSIS — E876 Hypokalemia: Secondary | ICD-10-CM

## 2014-11-12 DIAGNOSIS — L089 Local infection of the skin and subcutaneous tissue, unspecified: Secondary | ICD-10-CM | POA: Diagnosis present

## 2014-11-12 LAB — BASIC METABOLIC PANEL
Anion gap: 7 (ref 5–15)
BUN: 9 mg/dL (ref 6–20)
CALCIUM: 8.8 mg/dL — AB (ref 8.9–10.3)
CO2: 29 mmol/L (ref 22–32)
CREATININE: 0.83 mg/dL (ref 0.61–1.24)
Chloride: 101 mmol/L (ref 101–111)
GFR calc Af Amer: >60 mL/min (ref >60)
GFR calc non Af Amer: >60 mL/min (ref >60)
GLUCOSE: 274 mg/dL — AB (ref 65–99)
Potassium: 3.8 mmol/L (ref 3.5–5.1)
Sodium: 137 mmol/L (ref 135–145)

## 2014-11-12 LAB — CBC
HEMATOCRIT: 30.7 % — AB (ref 39.0–52.0)
Hemoglobin: 10.8 g/dL — ABNORMAL LOW (ref 13.0–17.0)
MCH: 31 pg (ref 26.0–34.0)
MCHC: 35.2 g/dL (ref 30.0–36.0)
MCV: 88.2 fL (ref 78.0–100.0)
Platelets: 174 10*3/uL (ref 150–400)
RBC: 3.48 MIL/uL — ABNORMAL LOW (ref 4.22–5.81)
RDW: 12.1 % (ref 11.5–15.5)
WBC: 10.1 10*3/uL (ref 4.0–10.5)

## 2014-11-12 LAB — CULTURE, BLOOD (ROUTINE X 2)

## 2014-11-12 LAB — GLUCOSE, CAPILLARY
GLUCOSE-CAPILLARY: 142 mg/dL — AB (ref 65–99)
GLUCOSE-CAPILLARY: 207 mg/dL — AB (ref 65–99)
GLUCOSE-CAPILLARY: 219 mg/dL — AB (ref 65–99)
Glucose-Capillary: 192 mg/dL — ABNORMAL HIGH (ref 65–99)
Glucose-Capillary: 177 mg/dL — ABNORMAL HIGH (ref 65–99)

## 2014-11-12 MED ORDER — OXYCODONE HCL 5 MG PO TABS
5.0000 mg | ORAL_TABLET | Freq: Once | ORAL | Status: AC | PRN
Start: 2014-11-12 — End: 2014-11-12
  Administered 2014-11-12: 5 mg via ORAL
  Filled 2014-11-12: qty 1

## 2014-11-12 MED ORDER — 0.9 % SODIUM CHLORIDE (POUR BTL) OPTIME
TOPICAL | Status: DC | PRN
  Administered 2014-11-12: 1000 mL

## 2014-11-12 MED ORDER — FENTANYL CITRATE (PF) 100 MCG/2ML IJ SOLN
INTRAMUSCULAR | Status: DC | PRN
  Administered 2014-11-11: 100 ug via INTRAVENOUS

## 2014-11-12 MED ORDER — GADOPENTETATE DIMEGLUMINE 469.01 MG/ML IV SOLN
20.0000 mL | Freq: Once | INTRAVENOUS | Status: AC | PRN
  Administered 2014-11-12: 20 mL via INTRAVENOUS

## 2014-11-12 MED ORDER — ONDANSETRON HCL 4 MG/2ML IJ SOLN
INTRAMUSCULAR | Status: AC
  Filled 2014-11-12: qty 2

## 2014-11-12 MED ORDER — GADOBENATE DIMEGLUMINE 529 MG/ML IV SOLN
20.0000 mL | Freq: Once | INTRAVENOUS | Status: AC | PRN
  Administered 2014-11-10: 20 mL via INTRAVENOUS

## 2014-11-12 MED ORDER — HYDROMORPHONE HCL 1 MG/ML IJ SOLN: 0.2500 mg | INTRAMUSCULAR | Status: DC | PRN

## 2014-11-12 MED ORDER — LIDOCAINE HCL (CARDIAC) 20 MG/ML IV SOLN
INTRAVENOUS | Status: DC | PRN
  Administered 2014-11-11: 80 mg via INTRAVENOUS

## 2014-11-12 MED ORDER — METOCLOPRAMIDE HCL 5 MG/ML IJ SOLN: 5.0000 mg | Freq: Three times a day (TID) | INTRAMUSCULAR | Status: DC | PRN

## 2014-11-12 MED ORDER — METOCLOPRAMIDE HCL 5 MG PO TABS: 5.0000 mg | ORAL_TABLET | Freq: Three times a day (TID) | ORAL | Status: DC | PRN

## 2014-11-12 MED ORDER — ACETAMINOPHEN 325 MG PO TABS: 650.0000 mg | ORAL_TABLET | Freq: Four times a day (QID) | ORAL | Status: DC | PRN

## 2014-11-12 MED ORDER — OXYCODONE HCL 5 MG/5ML PO SOLN: 5.0000 mg | Freq: Once | ORAL | Status: AC | PRN

## 2014-11-12 MED ORDER — ONDANSETRON HCL 4 MG/2ML IJ SOLN
INTRAMUSCULAR | Status: DC | PRN
  Administered 2014-11-12: 4 mg via INTRAVENOUS

## 2014-11-12 MED ORDER — ONDANSETRON HCL 4 MG PO TABS: 4.0000 mg | ORAL_TABLET | Freq: Four times a day (QID) | ORAL | Status: DC | PRN

## 2014-11-12 MED ORDER — ACETAMINOPHEN 650 MG RE SUPP: 650.0000 mg | Freq: Four times a day (QID) | RECTAL | Status: DC | PRN

## 2014-11-12 MED ORDER — ONDANSETRON HCL 4 MG/2ML IJ SOLN: 4.0000 mg | Freq: Four times a day (QID) | INTRAMUSCULAR | Status: DC | PRN

## 2014-11-12 MED ORDER — PROPOFOL 10 MG/ML IV BOLUS
INTRAVENOUS | Status: DC | PRN
  Administered 2014-11-11: 200 mg via INTRAVENOUS

## 2014-11-12 MED ORDER — MEPERIDINE HCL 25 MG/ML IJ SOLN: 6.2500 mg | INTRAMUSCULAR | Status: DC | PRN

## 2014-11-12 NOTE — Brief Op Note (Signed)
11/08/2014 - 11/12/2014  12:34 AM  PATIENT:  Darren Skinner  54 y.o. male  PRE-OPERATIVE DIAGNOSIS:  GANGRENE LEFT TOE  POST-OPERATIVE DIAGNOSIS:  GANGRENE LEFT TOE  PROCEDURE:  Procedure(s): Incision and drainage of left great toe  SURGEON:  Surgeon(s): Cammy Copa, MD  ASSISTANT: none  ANESTHESIA:   general  EBL: 7 ml       BLOOD ADMINISTERED: none  DRAINS: none   LOCAL MEDICATIONS USED:  none  SPECIMEN:  No Specimen  COUNTS:  YES  TOURNIQUET:  * No tourniquets in log *  DICTATION: .Other Dictation: Dictation Number done  PLAN OF CARE: Admit to inpatient   PATIENT DISPOSITION:  PACU - hemodynamically stable

## 2014-11-12 NOTE — Evaluation (Signed)
Physical Therapy Evaluation Patient Details Name: Darren Skinner MRN: [id number] DOB: March 09, 1960 Today's Date: 11/12/2014   History of Present Illness  Left great toe excisional debridement due to great toe infection  Clinical Impression  Patient is s/p above surgery resulting in functional limitations due to the deficits listed below (see PT Problem List).  Patient will benefit from skilled PT to increase their independence and safety with mobility to allow discharge to home. Currently patient preference is to not use any assistive device but ambulation distance was limited. Will continue to follow to ensure safe mobility before returning home.        Follow Up Recommendations No PT follow up;Supervision for mobility/OOB    Equipment Recommendations  None recommended by PT;Other (comment) (patient preference is no device, will modify if needed. )    Recommendations for Other Services       Precautions / Restrictions Precautions Precautions: None Required Braces or Orthoses: Other Brace/Splint Other Brace/Splint: post-op shoe Restrictions Weight Bearing Restrictions: Yes LLE Weight Bearing: Weight bearing as tolerated      Mobility  Bed Mobility Overal bed mobility: Modified Independent             General bed mobility comments: supine to sit  Transfers Overall transfer level: Needs assistance Equipment used: None Transfers: Sit to/from Stand Sit to Stand: Min guard         General transfer comment: declined using any assistive devices  Ambulation/Gait Ambulation/Gait assistance: Min guard Ambulation Distance (Feet): 45 Feet Assistive device: None (trial rw X 5 feet, no help. ) Gait Pattern/deviations: Step-to pattern;Decreased step length - right;Decreased weight shift to left Gait velocity: decreased   General Gait Details: no loss of balance with ambulation but mild instability noted at times. Did trial rw but patient reporting that it did not help and  preferred to not use.   Stairs            Wheelchair Mobility    Modified Rankin (Stroke Patients Only)       Balance Overall balance assessment: Needs assistance Sitting-balance support: No upper extremity supported Sitting balance-Leahy Scale: Good     Standing balance support: No upper extremity supported Standing balance-Leahy Scale: Fair                               Pertinent Vitals/Pain Pain Assessment: 0-10 Pain Score: 2  Pain Location: Lt foot Pain Descriptors / Indicators: Sore;Heaviness Pain Intervention(s): Limited activity within patient's tolerance;Monitored during session    Home Living Family/patient expects to be discharged to:: Private residence Living Arrangements: Parent (mother) Available Help at Discharge: Family Type of Home: House Home Access: Level entry;Other (comment) (through back entrance)     Home Layout: One level Home Equipment: None      Prior Function Level of Independence: Independent               Hand Dominance        Extremity/Trunk Assessment               Lower Extremity Assessment: Overall WFL for tasks assessed         Communication   Communication: No difficulties  Cognition Arousal/Alertness: Awake/alert Behavior During Therapy: WFL for tasks assessed/performed Overall Cognitive Status: Within Functional Limits for tasks assessed                      General Comments  Exercises        Assessment/Plan    PT Assessment Patient needs continued PT services  PT Diagnosis Difficulty walking;Acute pain   PT Problem List Decreased strength;Decreased range of motion;Decreased activity tolerance;Decreased balance;Decreased mobility;Decreased knowledge of use of DME;Pain  PT Treatment Interventions DME instruction;Gait training;Stair training;Functional mobility training;Therapeutic activities;Therapeutic exercise;Balance training;Patient/family education   PT Goals  (Current goals can be found in the Care Plan section) Acute Rehab PT Goals Patient Stated Goal: be able to get up and move without pain PT Goal Formulation: With patient Time For Goal Achievement: 11/26/14 Potential to Achieve Goals: Good    Frequency Min 3X/week   Barriers to discharge        Co-evaluation               End of Session Equipment Utilized During Treatment: Gait belt;Other (comment) (post-op shoe) Activity Tolerance: Patient tolerated treatment well Patient left: in chair;with call bell/phone within reach;Other (comment) (LLE elevated) Nurse Communication: Mobility status         Time: 1610-9604 PT Time Calculation (min) (ACUTE ONLY): 24 min   Charges:   PT Evaluation $Initial PT Evaluation Tier I: 1 Procedure PT Treatments $Gait Training: 8-22 mins   PT G Codes:        Christiane Ha, PT, CSCS Pager [phone] Office [phone]  11/12/2014, 12:06 PM

## 2014-11-12 NOTE — Progress Notes (Signed)
[facility]                                                                              Patient Demographics  Darren Skinner, is a 54 y.o. male, DOB - [birth year], NWG:[id number]  Admit date - 11/08/2014   Admitting Physician Rolly Salter, MD  Outpatient Primary MD for the patient is  Duane Lope, MD  LOS - 4   Chief Complaint  Patient presents with  . Wound Infection      HPI on 11/08/2014 by Dr. Lynden Oxford Darren Skinner is a 54 y.o. male with Past medical history of hypothyroidism, diabetes mellitus with neuropathy, mood disorder. The patient is presenting with complaints of infection of the left toe. Patient mentions that he he has chronic callus which she picks on and off. One month ago he picked his callus on the left foot and later on he started having redness which was followed by increasing swelling. This was followed by increasing pain while emulating over last 1 week. Therefore he decided to go to see his PCP. In the office he was told that he may have to have his toe amputated and related to go to ER for further workup. Despite treatment at home the patient's pain was not improving and therefore he decided to come to the hospital. He denies any fall trauma or injury denies having any fever or chills no chest pain and abdominal pain. He complains of leg tenderness in the left leg. He denies having any burning urination diarrhea or constipation. The patient is coming from home And is independent for most of his ADL; manages his medication on his own.  Assessment & Plan   Sepsis secondary to left diabetic foot ulcer/abscess/gangrenous left toe -Patient continues to be afebrile with no leukocytosis -MRI shows sialitis of the first great toe with soft tissue ulceration along the medial aspect, no osteomyelitis -Orthopedic surgery consulted and appreciated, s/p I&D of left great toe -Continue vancomycin and Zosyn -Pending PT  Diabetes mellitus, type II with  neuropathy -Hemoglobin A1c 8.1 -Continue insulin sliding scale, CBG monitoring -Continue gabapentin  Tobacco use disorder -Counseled regarding smoking cessation  Pancytopenia -TSH within normal limits, HIV nonreactive -Improving, likely secondary to sepsis -Hemoglobin remained stable, 10.8 today -Continue to monitor CBC  Hypokalemia -Resolved, continue to monitor BMP and replace as needed  Hypothyroidism -Continue Synthroid  Code Status: Full  Family Communication: Mother at bedside  Disposition Plan: Admitted, pending further recommendations from orthopedics and physical therapy evaluation  Time Spent in minutes   30 minutes  Procedures  I&D   Consults   Orthopedic surgery  DVT Prophylaxis  Lovenox  Lab Results  Component Value Date   PLT 174 11/12/2014    Medications  Scheduled Meds: . DULoxetine  60 mg Oral Daily  . enoxaparin (LOVENOX) injection  40 mg Subcutaneous Q24H  . feeding supplement (GLUCERNA SHAKE)  237 mL Oral TID BM  . gabapentin  800 mg Oral TID  . insulin aspart  0-15 Units Subcutaneous TID WC  . insulin aspart  0-5 Units Subcutaneous QHS  . levothyroxine  175 mcg Oral QAC breakfast  . piperacillin-tazobactam (ZOSYN)  IV  3.375  g Intravenous 3 times per day  . sodium chloride  3 mL Intravenous Q12H  . valACYclovir  500 mg Oral Daily  . vancomycin  1,250 mg Intravenous Q8H   Continuous Infusions:  PRN Meds:.acetaminophen **OR** acetaminophen, fentaNYL (SUBLIMAZE) injection, HYDROcodone-acetaminophen, HYDROmorphone (DILAUDID) injection, meperidine (DEMEROL) injection, metoCLOPramide **OR** metoCLOPramide (REGLAN) injection, ondansetron **OR** ondansetron (ZOFRAN) IV, zolpidem  Antibiotics    Anti-infectives    Start     Dose/Rate Route Frequency Ordered Stop   11/11/14 0400  vancomycin (VANCOCIN) 1,250 mg in sodium chloride 0.9 % 250 mL IVPB     1,250 mg 166.7 mL/hr over 90 Minutes Intravenous Every 8 hours 11/10/14 2046     11/09/14  1000  valACYclovir (VALTREX) tablet 500 mg     500 mg Oral Daily 11/08/14 2302     11/09/14 0400  vancomycin (VANCOCIN) IVPB 1000 mg/200 mL premix  Status:  Discontinued     1,000 mg 200 mL/hr over 60 Minutes Intravenous Every 8 hours 11/08/14 1947 11/10/14 2046   11/09/14 0200  piperacillin-tazobactam (ZOSYN) IVPB 3.375 g     3.375 g 12.5 mL/hr over 240 Minutes Intravenous 3 times per day 11/08/14 1947     11/08/14 2315  cefTRIAXone (ROCEPHIN) 2 g in dextrose 5 % 50 mL IVPB  Status:  Discontinued     2 g 100 mL/hr over 30 Minutes Intravenous Every 24 hours 11/08/14 2302 11/08/14 2302   11/08/14 2315  metroNIDAZOLE (FLAGYL) IVPB 500 mg  Status:  Discontinued     500 mg 100 mL/hr over 60 Minutes Intravenous Every 8 hours 11/08/14 2302 11/08/14 2302   11/08/14 1945  piperacillin-tazobactam (ZOSYN) IVPB 3.375 g     3.375 g 100 mL/hr over 30 Minutes Intravenous  Once 11/08/14 1930 11/08/14 2025   11/08/14 1930  vancomycin (VANCOCIN) 1,750 mg in sodium chloride 0.9 % 500 mL IVPB     1,750 mg 250 mL/hr over 120 Minutes Intravenous  Once 11/08/14 1930 11/08/14 2220        Subjective:   Darren Skinner seen and examined today.  Patient currently denies any chest pain, shortness breath, abdominal pain. Continues to complain of left leg pain although feels that the swelling has improved.  Objective:   Filed Vitals:   11/12/14 0045 11/12/14 0100 11/12/14 0149 11/12/14 0547  BP: 111/58 110/66 109/60 120/59  Pulse: 76 71 64 78  Temp:  98.6 F (37 C) 97.6 F (36.4 C) 98.4 F (36.9 C)  TempSrc:   Oral Oral  Resp: Height:      Weight:    98.6 kg (217 lb 6 oz)  SpO2: 92% 96% 97% 98%    Wt Readings from Last 3 Encounters:  11/12/14 98.6 kg (217 lb 6 oz)  10/01/14 89.812 kg (198 lb)  04/11/11 101.696 kg (224 lb 3.2 oz)     Intake/Output Summary (Last 24 hours) at 11/12/14 1205 Last data filed at 11/12/14 0500  Gross per 24 hour  Intake    700 ml  Output    400 ml  Net     300 ml    Exam  General: Well developed, well nourished, NAD  HEENT: NCAT,  mucous membranes moist.   Cardiovascular: S1 S2 auscultated, RRR, no m/r/g  Respiratory: Clear to auscultation bilaterally with equal chest rise  Abdomen: Soft, nontender, nondistended, + bowel sounds  Extremities: warm dry without cyanosis clubbing. LLE edema, bandage on great toe, erythema noted on the left foot.  Neuro:  AAOx3, nonfocal  Psych: Normal affect and demeanor   Data Review   Micro Results Recent Results (from the past 240 hour(s))  Blood Culture (routine x 2)     Status: None (Preliminary result)   Collection Time: 11/08/14 12:23 PM  Result Value Ref Range Status   Specimen Description BLOOD LEFT FOREARM  Final   Special Requests BOTTLES DRAWN AEROBIC AND ANAEROBIC 10CC  Final   Culture  Setup Time   Final    GRAM POSITIVE COCCI IN CHAINS IN BOTH AEROBIC AND ANAEROBIC BOTTLES CRITICAL RESULT CALLED TO, READ BACK BY AND VERIFIED WITH: M YIN @0448  11/09/14 MKELLY    Culture   Final    VIRIDANS STREPTOCOCCUS THE SIGNIFICANCE OF ISOLATING THIS ORGANISM FROM A SINGLE SET OF BLOOD CULTURES WHEN MULTIPLE SETS ARE DRAWN IS UNCERTAIN. PLEASE NOTIFY THE MICROBIOLOGY DEPARTMENT WITHIN ONE WEEK IF SPECIATION AND SENSITIVITIES ARE REQUIRED.    Report Status PENDING  Incomplete  Urine culture     Status: None   Collection Time: 11/08/14  6:55 PM  Result Value Ref Range Status   Specimen Description URINE, CLEAN CATCH  Final   Special Requests NONE  Final   Culture NO GROWTH 1 DAY  Final   Report Status 11/09/2014 FINAL  Final  Blood Culture (routine x 2)     Status: None (Preliminary result)   Collection Time: 11/08/14 10:14 PM  Result Value Ref Range Status   Specimen Description BLOOD RIGHT FOREARM  Final   Special Requests BOTTLES DRAWN AEROBIC AND ANAEROBIC 10CC  Final   Culture NO GROWTH 3 DAYS  Final   Report Status PENDING  Incomplete  Culture, blood (routine x 2)     Status: None  (Preliminary result)   Collection Time: 11/09/14 12:35 PM  Result Value Ref Range Status   Specimen Description BLOOD RIGHT ARM  Final   Special Requests BOTTLES DRAWN AEROBIC AND ANAEROBIC 7CC  Final   Culture NO GROWTH 2 DAYS  Final   Report Status PENDING  Incomplete  Culture, blood (routine x 2)     Status: None (Preliminary result)   Collection Time: 11/09/14 12:45 PM  Result Value Ref Range Status   Specimen Description BLOOD RIGHT HAND  Final   Special Requests BOTTLES DRAWN AEROBIC ONLY  3CC  Final   Culture NO GROWTH 2 DAYS  Final   Report Status PENDING  Incomplete  MRSA PCR Screening     Status: None   Collection Time: 11/09/14  2:13 PM  Result Value Ref Range Status   MRSA by PCR NEGATIVE NEGATIVE Final    Comment:        The GeneXpert MRSA Assay (FDA approved for NASAL specimens only), is one component of a comprehensive MRSA colonization surveillance program. It is not intended to diagnose MRSA infection nor to guide or monitor treatment for MRSA infections.   Anaerobic culture     Status: None (Preliminary result)   Collection Time: 11/12/14 12:07 AM  Result Value Ref Range Status   Specimen Description WOUND LEFT TOE  Final   Special Requests NONE  Final   Gram Stain   Final    RARE WBC PRESENT,BOTH PMN AND MONONUCLEAR NO SQUAMOUS EPITHELIAL CELLS SEEN NO ORGANISMS SEEN Performed at [facility]    Culture   Final    NO ANAEROBES ISOLATED; CULTURE IN PROGRESS FOR 5 DAYS Performed at [facility]    Report Status PENDING  Incomplete  Wound culture  Status: None (Preliminary result)   Collection Time: 11/12/14 12:07 AM  Result Value Ref Range Status   Specimen Description WOUND LEFT TOE  Final   Special Requests NONE  Final   Gram Stain   Final    RARE WBC PRESENT, PREDOMINANTLY PMN NO SQUAMOUS EPITHELIAL CELLS SEEN NO ORGANISMS SEEN Performed at [facility]    Culture NO GROWTH Performed at [facility]    Final   Report Status PENDING  Incomplete    Radiology Reports Mr Foot Left W Wo Contrast  11/10/2014   CLINICAL DATA:  Toe infection, ulceration, drainage. History diabetes.  EXAM: MRI OF THE LEFT FOREFOOT WITHOUT AND WITH CONTRAST  TECHNIQUE: Multiplanar, multisequence MR imaging was performed both before and after administration of intravenous contrast.  CONTRAST:  20 mL MultiHance  COMPARISON:  None.  FINDINGS: There is soft tissue ulceration over the medial aspect of the first distal phalanx with surrounding soft tissue edema and enhancement. 2.5 x 1 cm fluid collection along the plantar aspect of the first distal phalanx consistent with an abscess. 6 mm fluid collection along the dorsal aspect of the great toe likely representing a small abscess. There is no significant marrow signal abnormality. There is no cortical destruction. There is no joint effusion.  There is no other marrow signal abnormality. There is no acute fracture or dislocation. There is mild osteoarthritis of the first MTP joint.  IMPRESSION: 1. Cellulitis of the first great toe with soft tissue ulceration along the medial aspect. No evidence of osteomyelitis of the first great toe. 2.5 x 1 cm abscess along the plantar aspect of the first distal phalanx. 6 mm abscess along the dorsal aspect of the great toe.   Electronically Signed   By: Elige Ko   On: 11/10/2014 13:01   Dg Toe Great Left  11/08/2014   CLINICAL DATA:  54 year old with diabetic ulcer involving the left great toe which began approximately 2 months ago.  EXAM: LEFT GREAT TOE 3 VIEWS  COMPARISON:  None.  FINDINGS: Marked diffuse soft tissue swelling. Ulceration involving the plantar surface. No evidence of osteomyelitis. No evidence of acute or subacute fracture or dislocation. Well preserved joint spaces. Well preserved bone mineral density.  IMPRESSION: Soft tissue swelling and ulceration.  No osseous abnormality.   Electronically Signed   By: Hulan Saas M.D.    On: 11/08/2014 19:52    CBC  Recent Labs Lab 11/08/14 1225 11/09/14 0600 11/09/14 1235 11/11/14 0417 11/12/14 0439  WBC 9.3 12.2* 3.6* 7.6 10.1  HGB 13.5 11.3* 12.3* 10.8* 10.8*  HCT 38.5* 33.0* 35.3* 29.8* 30.7*  PLT 168 142* 140* 147* 174  MCV 88.5 88.9 88.9 86.1 88.2  MCH 31.0 30.5 31.0 31.2 31.0  MCHC 35.1 34.2 34.8 36.2* 35.2  RDW 12.0 12.1 12.2 12.1 12.1  LYMPHSABS 0.6* 0.6*  --   --   --   MONOABS 0.2 0.5  --   --   --   EOSABS 0.1 0.0  --   --   --   BASOSABS 0.0 0.0  --   --   --     Chemistries   Recent Labs Lab 11/08/14 1225 11/09/14 0600 11/09/14 1235 11/10/14 1422 11/11/14 0417 11/12/14 0439  NA 139 134*  --  132* 135 137  K 4.0 3.4*  --  3.3* 3.4* 3.8  CL 104 101  --  101 103 101  CO2 23 24  --  25 26 29   GLUCOSE  149* 227*  --  212* 194* 274*  BUN 18 14  --  CREATININE 0.89 1.07  --  0.86 0.72 0.83  CALCIUM 9.8 8.4*  --  8.3* 8.7* 8.8*  MG  --   --  1.4*  --  1.6*  --   AST 19 32  --   --   --   --   ALT 18 23  --   --   --   --   ALKPHOS 70 68  --   --   --   --   BILITOT 0.9 1.6*  --   --   --   --    ------------------------------------------------------------------------------------------------------------------ estimated creatinine clearance is 118.3 mL/min (by C-G formula based on Cr of 0.83). ------------------------------------------------------------------------------------------------------------------ No results for input(s): HGBA1C in the last 72 hours. ------------------------------------------------------------------------------------------------------------------ No results for input(s): CHOL, HDL, LDLCALC, TRIG, CHOLHDL, LDLDIRECT in the last 72 hours. ------------------------------------------------------------------------------------------------------------------ No results for input(s): TSH, T4TOTAL, T3FREE, THYROIDAB in the last 72 hours.  Invalid input(s):  FREET3 ------------------------------------------------------------------------------------------------------------------ No results for input(s): VITAMINB12, FOLATE, FERRITIN, TIBC, IRON, RETICCTPCT in the last 72 hours.  Coagulation profile No results for input(s): INR, PROTIME in the last 168 hours.  No results for input(s): DDIMER in the last 72 hours.  Cardiac Enzymes  Recent Labs Lab 11/09/14 1630 11/09/14 2215 11/10/14 0508  TROPONINI 0.04* <0.03 0.03   ------------------------------------------------------------------------------------------------------------------ Invalid input(s): POCBNP    MIKHAIL, MARYANN D.O. on 11/12/2014 at 12:05 PM  Between 7am to 7pm - Pager - [phone]  After 7pm go to [email or web address] - password [email or web address]  And look for the night coverage person covering for me after hours  [facility] Office  [phone]

## 2014-11-12 NOTE — Transfer of Care (Signed)
Immediate Anesthesia Transfer of Care Note  Patient: Darren Skinner  Procedure(s) Performed: Procedure(s): Incision and drainage of left great toe (Left)  Patient Location: PACU  Anesthesia Type:General  Level of Consciousness: awake, alert  and oriented  Airway & Oxygen Therapy: Patient connected to nasal cannula oxygen  Post-op Assessment: Report given to RN, Post -op Vital signs reviewed and stable and Patient moving all extremities X 4  Post vital signs: Reviewed and stable  Last Vitals:  Filed Vitals:   11/11/14 2147  BP: 131/77  Pulse: 68  Temp: 37.3 C  Resp: 16    Complications: No apparent anesthesia complications

## 2014-11-12 NOTE — Care Management Note (Signed)
Case Management Note  Patient Details  Name: [name] MRN: [id number] Date of Birth: Jan 04, 1961  Subjective/Objective:               admitted with diabetic ulcer, infected left great toe     Action/Plan: Spoke with patient about HHC, he is agreeable to [facility]. Contacted katie at [facility] and set up [facility] visits for assistance with drsg changes. Patient states that he is willing to learn drsg changes and his mother will also be able to assist. No equipment needs identified.   Expected Discharge Date:                  Expected Discharge Plan:  Home w Home Health Services  In-House Referral:  [facility] (Pt with no insurance)  Discharge planning Services  CM Consult  Post Acute Care Choice:  Home Health Choice offered to:  Patient  DME Arranged:    DME Agency:     HH Arranged:  RN HH Agency:  [facility]  Status of Service:  In process, will continue to follow  Medicare Important Message Given:    Date Medicare IM Given:    Medicare IM give by:    Date Additional Medicare IM Given:    Additional Medicare Important Message give by:     If discussed at Long Length of Stay Meetings, dates discussed:    Additional Comments:  Monica Becton, RN 11/12/2014, 4:25 PM

## 2014-11-12 NOTE — Progress Notes (Signed)
Pt stable - doing ok Toe perfused Ok for dc Sunday Needs dressing change and packing change sat then hhc for m/w/f/packing and dressing change Continue iv abx thru sat and dc sun if medically ok - will need 2 w of po abx on dc

## 2014-11-12 NOTE — Anesthesia Postprocedure Evaluation (Signed)
  Anesthesia Post-op Note  Patient: Darren Skinner  Procedure(s) Performed: Procedure(s): Incision and drainage of left great toe (Left)  Patient Location: PACU  Anesthesia Type: General   Level of Consciousness: awake, alert  and oriented  Airway and Oxygen Therapy: Patient Spontanous Breathing  Post-op Pain: none  Post-op Assessment: Post-op Vital signs reviewed  Post-op Vital Signs: Reviewed  Last Vitals:  Filed Vitals:   11/12/14 0547  BP: 120/59  Pulse: 78  Temp: 36.9 C  Resp: 19    Complications: No apparent anesthesia complications

## 2014-11-12 NOTE — Anesthesia Procedure Notes (Signed)
Procedure Name: LMA Insertion Date/Time: 11/11/2014 11:55 PM Performed by: Molli Hazard Pre-anesthesia Checklist: Patient identified, Emergency Drugs available, Suction available and Patient being monitored Patient Re-evaluated:Patient Re-evaluated prior to inductionOxygen Delivery Method: Circle system utilized Preoxygenation: Pre-oxygenation with 100% oxygen Intubation Type: IV induction Ventilation: Mask ventilation without difficulty LMA: LMA inserted LMA Size: 5.0 Number of attempts: 1 Tube secured with: Tape Dental Injury: Teeth and Oropharynx as per pre-operative assessment

## 2014-11-12 NOTE — Op Note (Signed)
NAMELEWAYNE, PAULEY NO.:  [id number]  MEDICAL RECORD NO.:  [id number]  LOCATION:  5N10C                        FACILITY:  [facility]  PHYSICIAN:  Burnard Bunting, M.D.    DATE OF BIRTH:  1960/08/08  DATE OF PROCEDURE: DATE OF DISCHARGE:                              OPERATIVE REPORT   PREOPERATIVE DIAGNOSIS:  Left great toe infection.  POSTOPERATIVE DIAGNOSIS:  Left great toe infection.  PROCEDURE:  Left great toe excisional debridement with cultures of left great toe infection.  INDICATIONS:  Darren Skinner is a 54 year old patient with left great toe infection presents for operative management after explanation of risks and benefits.  MRI scan does confirm plantar and dorsal abscess around the great toe and no direct bone involvement.  Cultures obtained x1.  PROCEDURE IN DETAIL:  The patient was brought to the operating room, where general anesthetic was administered.  Perioperative antibiotics were administered.  Time-out was called.  Left great toe was prepped with Hibiclens, draped in sterile manner.  The patient had a plantar ulcer, this was debrided.  The callus around the ulcer was debrided. This plantar ulcer expanded into a cavity on the plantar aspect of the toe which wrapped around the lateral aspect of the toe.  Callus was removed.  Skin debridement was performed.  Subcutaneous tissue and fascia debridement also performed.  The ulceration was enlarged.  Using a curette, soft tissue debridement was performed of the cavity.  On the dorsal aspect also, a longitudinal incision was made just proximal to the nail bed.  This area was opened as well.  There was a second area which measured about a 1 cm which also was on the medial aspect of the toe which was contiguous with the plantar ulcer.  This area was also opened and debrided.  Following excisional debridement, the bone was probed and found to be intact.  No evidence of osteomyelitis, although the cavity  did go down to the bone.  Irrigation of the abscess cavity which had been debrided was then performed with 1 L of irrigating solution.  Iodoform quarter-inch gauze was then packed into the toe which was then wrapped.  The patient was then extubated and transferred to the recovery room.  Tolerated the procedure well without immediate complications.  There was some definite venous congestion of the toe at the conclusion of the case.  I think the perfusion on both arterial and venous aspect of the toe was compromised both before the case as well as after the case.  It is difficult to say if this toe is going to survive with this infection.     Burnard Bunting, M.D.     GSD/MEDQ  D:  11/12/2014  T:  11/12/2014  Job:  [id number]

## 2014-11-13 LAB — BASIC METABOLIC PANEL
Anion gap: 8 (ref 5–15)
BUN: 9 mg/dL (ref 6–20)
CALCIUM: 9.1 mg/dL (ref 8.9–10.3)
CHLORIDE: 103 mmol/L (ref 101–111)
CO2: 30 mmol/L (ref 22–32)
CREATININE: 0.86 mg/dL (ref 0.61–1.24)
GFR calc non Af Amer: >60 mL/min (ref >60)
Glucose, Bld: 168 mg/dL — ABNORMAL HIGH (ref 65–99)
Potassium: 4 mmol/L (ref 3.5–5.1)
SODIUM: 141 mmol/L (ref 135–145)

## 2014-11-13 LAB — GLUCOSE, CAPILLARY
GLUCOSE-CAPILLARY: 149 mg/dL — AB (ref 65–99)
GLUCOSE-CAPILLARY: 218 mg/dL — AB (ref 65–99)
GLUCOSE-CAPILLARY: 232 mg/dL — AB (ref 65–99)
Glucose-Capillary: 175 mg/dL — ABNORMAL HIGH (ref 65–99)

## 2014-11-13 LAB — CBC
HCT: 32 % — ABNORMAL LOW (ref 39.0–52.0)
Hemoglobin: 11.3 g/dL — ABNORMAL LOW (ref 13.0–17.0)
MCH: 31.5 pg (ref 26.0–34.0)
MCHC: 35.3 g/dL (ref 30.0–36.0)
MCV: 89.1 fL (ref 78.0–100.0)
Platelets: 188 10*3/uL (ref 150–400)
RBC: 3.59 MIL/uL — ABNORMAL LOW (ref 4.22–5.81)
RDW: 12.3 % (ref 11.5–15.5)
WBC: 7.6 10*3/uL (ref 4.0–10.5)

## 2014-11-13 LAB — CULTURE, BLOOD (ROUTINE X 2): CULTURE: NO GROWTH

## 2014-11-13 MED ORDER — AMOXICILLIN-POT CLAVULANATE 875-125 MG PO TABS
1.0000 | ORAL_TABLET | Freq: Two times a day (BID) | ORAL | Status: DC
  Administered 2014-11-13 (×2): 1 via ORAL
  Filled 2014-11-13 (×3): qty 1

## 2014-11-13 NOTE — Progress Notes (Signed)
[facility]                                                                              Patient Demographics  Darren Skinner, is a 54 y.o. male, DOB - 1960-12-10, HYQ:[id number]  Admit date - 11/08/2014   Admitting Physician Darren Salter, MD  Outpatient Primary MD for the patient is  Darren Lope, MD  LOS - 5   Chief Complaint  Patient presents with  . Wound Infection      HPI on 11/08/2014 by Dr. Lynden Skinner Darren Skinner is a 54 y.o. male with Past medical history of hypothyroidism, diabetes mellitus with neuropathy, mood disorder. The patient is presenting with complaints of infection of the left toe. Patient mentions that he he has chronic callus which she picks on and off. One month ago he picked his callus on the left foot and later on he started having redness which was followed by increasing swelling. This was followed by increasing pain while emulating over last 1 week. Therefore he decided to go to see his PCP. In the office he was told that he may have to have his toe amputated and related to go to ER for further workup. Despite treatment at home the patient's pain was not improving and therefore he decided to come to the hospital. He denies any fall trauma or injury denies having any fever or chills no chest pain and abdominal pain. He complains of leg tenderness in the left leg. He denies having any burning urination diarrhea or constipation. The patient is coming from home And is independent for most of his ADL; manages his medication on his own.  Assessment & Plan   Sepsis secondary to left diabetic foot ulcer/abscess/gangrenous left toe -Patient continues to be afebrile with no leukocytosis -MRI shows sialitis of the first great toe with soft tissue ulceration along the medial aspect, no osteomyelitis -Orthopedic surgery consulted and appreciated, s/p I&D of left great toe -PT: no PT follow up recommended -Initially placed on vancomycin and Zosyn.  Will transition to  Augmentin BID- will need for 14day -Dressing change today, with MWF packing and dressing changes -blood cultures negative to date -Wound culture: no organisms seen  Diabetes mellitus, type II with neuropathy -Hemoglobin A1c 8.1 -Continue insulin sliding scale, CBG monitoring -Continue gabapentin  Tobacco use disorder -Counseled regarding smoking cessation  Pancytopenia -TSH within normal limits, HIV nonreactive -Improving, likely secondary to sepsis -Hemoglobin remained stable, 11.3 today -Continue to monitor CBC  Hypokalemia -Resolved, continue to monitor BMP and replace as needed  Hypothyroidism -Continue Synthroid  Code Status: Full  Family Communication: None at bedside  Disposition Plan: Admitted, will have dressing changed today, d/c 9/25  Time Spent in minutes   30 minutes  Procedures  I&D   Consults   Orthopedic surgery  DVT Prophylaxis  Lovenox  Lab Results  Component Value Date   PLT 188 11/13/2014    Medications  Scheduled Meds: . amoxicillin-clavulanate  1 tablet Oral Q12H  . DULoxetine  60 mg Oral Daily  . enoxaparin (LOVENOX) injection  40 mg Subcutaneous Q24H  . feeding supplement (GLUCERNA SHAKE)  237 mL Oral TID BM  . gabapentin  800 mg Oral TID  . insulin aspart  0-15 Units Subcutaneous TID WC  . insulin aspart  0-5 Units Subcutaneous QHS  . levothyroxine  175 mcg Oral QAC breakfast  . sodium chloride  3 mL Intravenous Q12H  . valACYclovir  500 mg Oral Daily   Continuous Infusions:  PRN Meds:.acetaminophen **OR** acetaminophen, fentaNYL (SUBLIMAZE) injection, HYDROcodone-acetaminophen, HYDROmorphone (DILAUDID) injection, meperidine (DEMEROL) injection, metoCLOPramide **OR** metoCLOPramide (REGLAN) injection, ondansetron **OR** ondansetron (ZOFRAN) IV, zolpidem  Antibiotics    Anti-infectives    Start     Dose/Rate Route Frequency Ordered Stop   11/13/14 1100  amoxicillin-clavulanate (AUGMENTIN) 875-125 MG per tablet 1 tablet     1  tablet Oral Every 12 hours 11/13/14 1051     11/11/14 0400  vancomycin (VANCOCIN) 1,250 mg in sodium chloride 0.9 % 250 mL IVPB  Status:  Discontinued     1,250 mg 166.7 mL/hr over 90 Minutes Intravenous Every 8 hours 11/10/14 2046 11/13/14 1051   11/09/14 1000  valACYclovir (VALTREX) tablet 500 mg     500 mg Oral Daily 11/08/14 2302     11/09/14 0400  vancomycin (VANCOCIN) IVPB 1000 mg/200 mL premix  Status:  Discontinued     1,000 mg 200 mL/hr over 60 Minutes Intravenous Every 8 hours 11/08/14 1947 11/10/14 2046   11/09/14 0200  piperacillin-tazobactam (ZOSYN) IVPB 3.375 g  Status:  Discontinued     3.375 g 12.5 mL/hr over 240 Minutes Intravenous 3 times per day 11/08/14 1947 11/13/14 1051   11/08/14 2315  cefTRIAXone (ROCEPHIN) 2 g in dextrose 5 % 50 mL IVPB  Status:  Discontinued     2 g 100 mL/hr over 30 Minutes Intravenous Every 24 hours 11/08/14 2302 11/08/14 2302   11/08/14 2315  metroNIDAZOLE (FLAGYL) IVPB 500 mg  Status:  Discontinued     500 mg 100 mL/hr over 60 Minutes Intravenous Every 8 hours 11/08/14 2302 11/08/14 2302   11/08/14 1945  piperacillin-tazobactam (ZOSYN) IVPB 3.375 g     3.375 g 100 mL/hr over 30 Minutes Intravenous  Once 11/08/14 1930 11/08/14 2025   11/08/14 1930  vancomycin (VANCOCIN) 1,750 mg in sodium chloride 0.9 % 500 mL IVPB     1,750 mg 250 mL/hr over 120 Minutes Intravenous  Once 11/08/14 1930 11/08/14 2220        Subjective:   Darren Skinner seen and examined today.  Patient currently denies any chest pain, shortness breath, abdominal pain. Complains of soreness in the left leg.  Objective:   Filed Vitals:   11/12/14 0547 11/12/14 1234 11/12/14 2018 11/13/14 0336  BP: 120/59 101/57 112/63 121/79  Pulse: 78 66 63 55  Temp: 98.4 F (36.9 C) 98.3 F (36.8 C) 97.7 F (36.5 C) 98.1 F (36.7 C)  TempSrc: Oral Oral Oral Oral  Resp: 19 18 19 17   Height:      Weight: 98.6 kg (217 lb 6 oz)     SpO2: 98% 97% 95% 97%    Wt Readings from Last 3  Encounters:  11/12/14 98.6 kg (217 lb 6 oz)  10/01/14 89.812 kg (198 lb)  04/11/11 101.696 kg (224 lb 3.2 oz)     Intake/Output Summary (Last 24 hours) at 11/13/14 1055 Last data filed at 11/13/14 0900  Gross per 24 hour  Intake    480 ml  Output      0 ml  Net    480 ml    Exam  General: Well developed, well nourished, NAD  HEENT: NCAT,  mucous membranes moist.   Cardiovascular: S1 S2 auscultated, RRR, no m/r/g  Respiratory: Clear to auscultation bilaterally with equal chest rise  Abdomen: Soft, nontender, nondistended, + bowel sounds  Extremities: warm dry without cyanosis clubbing, or edema.  Erythema improved. Dressing on left great toe.  Neuro: AAOx3, nonfocal  Psych: Normal affect and demeanor, pleasant  Data Review   Micro Results Recent Results (from the past 240 hour(s))  Blood Culture (routine x 2)     Status: None   Collection Time: 11/08/14 12:23 PM  Result Value Ref Range Status   Specimen Description BLOOD LEFT FOREARM  Final   Special Requests BOTTLES DRAWN AEROBIC AND ANAEROBIC 10CC  Final   Culture  Setup Time   Final    GRAM POSITIVE COCCI IN CHAINS IN BOTH AEROBIC AND ANAEROBIC BOTTLES CRITICAL RESULT CALLED TO, READ BACK BY AND VERIFIED WITH: Jacques Navy  11/09/14 MKELLY    Culture   Final    VIRIDANS STREPTOCOCCUS THE SIGNIFICANCE OF ISOLATING THIS ORGANISM FROM A SINGLE SET OF BLOOD CULTURES WHEN MULTIPLE SETS ARE DRAWN IS UNCERTAIN. PLEASE NOTIFY THE MICROBIOLOGY DEPARTMENT WITHIN ONE WEEK IF SPECIATION AND SENSITIVITIES ARE REQUIRED.    Report Status 11/12/2014 FINAL  Final  Urine culture     Status: None   Collection Time: 11/08/14  6:55 PM  Result Value Ref Range Status   Specimen Description URINE, CLEAN CATCH  Final   Special Requests NONE  Final   Culture NO GROWTH 1 DAY  Final   Report Status 11/09/2014 FINAL  Final  Blood Culture (routine x 2)     Status: None (Preliminary result)   Collection Time: 11/08/14 10:14 PM  Result  Value Ref Range Status   Specimen Description BLOOD RIGHT FOREARM  Final   Special Requests BOTTLES DRAWN AEROBIC AND ANAEROBIC 10CC  Final   Culture NO GROWTH 4 DAYS  Final   Report Status PENDING  Incomplete  Culture, blood (routine x 2)     Status: None (Preliminary result)   Collection Time: 11/09/14 12:35 PM  Result Value Ref Range Status   Specimen Description BLOOD RIGHT ARM  Final   Special Requests BOTTLES DRAWN AEROBIC AND ANAEROBIC 7CC  Final   Culture NO GROWTH 3 DAYS  Final   Report Status PENDING  Incomplete  Culture, blood (routine x 2)     Status: None (Preliminary result)   Collection Time: 11/09/14 12:45 PM  Result Value Ref Range Status   Specimen Description BLOOD RIGHT HAND  Final   Special Requests BOTTLES DRAWN AEROBIC ONLY  3CC  Final   Culture NO GROWTH 3 DAYS  Final   Report Status PENDING  Incomplete  MRSA PCR Screening     Status: None   Collection Time: 11/09/14  2:13 PM  Result Value Ref Range Status   MRSA by PCR NEGATIVE NEGATIVE Final    Comment:        The GeneXpert MRSA Assay (FDA approved for NASAL specimens only), is one component of a comprehensive MRSA colonization surveillance program. It is not intended to diagnose MRSA infection nor to guide or monitor treatment for MRSA infections.   Anaerobic culture     Status: None (Preliminary result)   Collection Time: 11/12/14 12:07 AM  Result Value Ref Range Status   Specimen Description WOUND LEFT TOE  Final   Special Requests NONE  Final   Gram Stain   Final    RARE WBC PRESENT,BOTH PMN AND MONONUCLEAR NO SQUAMOUS EPITHELIAL  CELLS SEEN NO ORGANISMS SEEN Performed at [facility]    Culture   Final    NO ANAEROBES ISOLATED; CULTURE IN PROGRESS FOR 5 DAYS Performed at [facility]    Report Status PENDING  Incomplete  Wound culture     Status: None (Preliminary result)   Collection Time: 11/12/14 12:07 AM  Result Value Ref Range Status   Specimen Description WOUND  LEFT TOE  Final   Special Requests NONE  Final   Gram Stain   Final    RARE WBC PRESENT, PREDOMINANTLY PMN NO SQUAMOUS EPITHELIAL CELLS SEEN NO ORGANISMS SEEN Performed at [facility]    Culture   Final    Culture reincubated for better growth Performed at [facility]    Report Status PENDING  Incomplete    Radiology Reports Mr Foot Left W Wo Contrast  11/10/2014   CLINICAL DATA:  Toe infection, ulceration, drainage. History diabetes.  EXAM: MRI OF THE LEFT FOREFOOT WITHOUT AND WITH CONTRAST  TECHNIQUE: Multiplanar, multisequence MR imaging was performed both before and after administration of intravenous contrast.  CONTRAST:  20 mL MultiHance  COMPARISON:  None.  FINDINGS: There is soft tissue ulceration over the medial aspect of the first distal phalanx with surrounding soft tissue edema and enhancement. 2.5 x 1 cm fluid collection along the plantar aspect of the first distal phalanx consistent with an abscess. 6 mm fluid collection along the dorsal aspect of the great toe likely representing a small abscess. There is no significant marrow signal abnormality. There is no cortical destruction. There is no joint effusion.  There is no other marrow signal abnormality. There is no acute fracture or dislocation. There is mild osteoarthritis of the first MTP joint.  IMPRESSION: 1. Cellulitis of the first great toe with soft tissue ulceration along the medial aspect. No evidence of osteomyelitis of the first great toe. 2.5 x 1 cm abscess along the plantar aspect of the first distal phalanx. 6 mm abscess along the dorsal aspect of the great toe.   Electronically Signed   By: Elige Ko   On: 11/10/2014 13:01   Dg Toe Great Left  11/08/2014   CLINICAL DATA:  54 year old with diabetic ulcer involving the left great toe which began approximately 2 months ago.  EXAM: LEFT GREAT TOE 3 VIEWS  COMPARISON:  None.  FINDINGS: Marked diffuse soft tissue swelling. Ulceration involving the  plantar surface. No evidence of osteomyelitis. No evidence of acute or subacute fracture or dislocation. Well preserved joint spaces. Well preserved bone mineral density.  IMPRESSION: Soft tissue swelling and ulceration.  No osseous abnormality.   Electronically Signed   By: Hulan Saas M.D.   On: 11/08/2014 19:52    CBC  Recent Labs Lab 11/08/14 1225 11/09/14 0600 11/09/14 1235 11/11/14 0417 11/12/14 0439 11/13/14 0536  WBC 9.3 12.2* 3.6* 7.6 10.1 7.6  HGB 13.5 11.3* 12.3* 10.8* 10.8* 11.3*  HCT 38.5* 33.0* 35.3* 29.8* 30.7* 32.0*  PLT 168 142* 140* 147* 174 188  MCV 88.5 88.9 88.9 86.1 88.2 89.1  MCH 31.0 30.5 31.0 31.2 31.0 31.5  MCHC 35.1 34.2 34.8 36.2* 35.2 35.3  RDW 12.0 12.1 12.2 12.1 12.1 12.3  LYMPHSABS 0.6* 0.6*  --   --   --   --   MONOABS 0.2 0.5  --   --   --   --   EOSABS 0.1 0.0  --   --   --   --  BASOSABS 0.0 0.0  --   --   --   --     Chemistries   Recent Labs Lab 11/08/14 1225 11/09/14 0600 11/09/14 1235 11/10/14 1422 11/11/14 0417 11/12/14 0439 11/13/14 0536  NA 139 134*  --  132* 135 137 141  K 4.0 3.4*  --  3.3* 3.4* 3.8 4.0  CL 104 101  --  101 103 101 103  CO2 23 24  --  GLUCOSE 149* 227*  --  212* 194* 274* 168*  BUN 18 14  --  CREATININE 0.89 1.07  --  0.86 0.72 0.83 0.86  CALCIUM 9.8 8.4*  --  8.3* 8.7* 8.8* 9.1  MG  --   --  1.4*  --  1.6*  --   --   AST 19 32  --   --   --   --   --   ALT 18 23  --   --   --   --   --   ALKPHOS 70 68  --   --   --   --   --   BILITOT 0.9 1.6*  --   --   --   --   --    ------------------------------------------------------------------------------------------------------------------ estimated creatinine clearance is 114.2 mL/min (by C-G formula based on Cr of 0.86). ------------------------------------------------------------------------------------------------------------------ No results for input(s): HGBA1C in the last 72  hours. ------------------------------------------------------------------------------------------------------------------ No results for input(s): CHOL, HDL, LDLCALC, TRIG, CHOLHDL, LDLDIRECT in the last 72 hours. ------------------------------------------------------------------------------------------------------------------ No results for input(s): TSH, T4TOTAL, T3FREE, THYROIDAB in the last 72 hours.  Invalid input(s): FREET3 ------------------------------------------------------------------------------------------------------------------ No results for input(s): VITAMINB12, FOLATE, FERRITIN, TIBC, IRON, RETICCTPCT in the last 72 hours.  Coagulation profile No results for input(s): INR, PROTIME in the last 168 hours.  No results for input(s): DDIMER in the last 72 hours.  Cardiac Enzymes  Recent Labs Lab 11/09/14 1630 11/09/14 2215 11/10/14 0508  TROPONINI 0.04* <0.03 0.03   ------------------------------------------------------------------------------------------------------------------ Invalid input(s): POCBNP    MIKHAIL, MARYANN D.O. on 11/13/2014 at 10:55 AM  Between 7am to 7pm - Pager - [phone]  After 7pm go to [email or web address] - password [email or web address]  And look for the night coverage person covering for me after hours  [facility] Office  [phone]

## 2014-11-13 NOTE — Progress Notes (Signed)
Physical Therapy Discharge Patient Details Name: Darren Skinner MRN: [id number] DOB: Dec 31, 1960 Today's Date: 11/13/2014 Time:  -     Patient discharged from PT services secondary to: pt reports he is ambulating in the halls with cast shoe without difficulty and he feels hedoes not need any additional PT services.   Please see PT eval note for current level of functioning and progress toward goals.    Progress and discharge plan discussed with patient.  Pt able to state importance of elevating RLE and limiting time up on feet.  He reports increased swelling when standing/ambulating.         Maliik, Karner 11/13/2014, 11:19 AM  L[place]ona Mound, PT  [phone] 11/13/2014

## 2014-11-14 LAB — CULTURE, BLOOD (ROUTINE X 2)
CULTURE: NO GROWTH
Culture: NO GROWTH

## 2014-11-14 LAB — GLUCOSE, CAPILLARY: GLUCOSE-CAPILLARY: 153 mg/dL — AB (ref 65–99)

## 2014-11-14 MED ORDER — HYDROCODONE-ACETAMINOPHEN 5-325 MG PO TABS: 1.0000 | ORAL_TABLET | ORAL | Status: DC | PRN

## 2014-11-14 MED ORDER — AMOXICILLIN-POT CLAVULANATE 875-125 MG PO TABS: 1.0000 | ORAL_TABLET | Freq: Two times a day (BID) | ORAL | Status: DC

## 2014-11-14 NOTE — Discharge Instructions (Signed)
Diabetes and Foot Care Diabetes may cause you to have problems because of poor blood supply (circulation) to your feet and legs. This may cause the skin on your feet to become thinner, break easier, and heal more slowly. Your skin may become dry, and the skin may peel and crack. You may also have nerve damage in your legs and feet causing decreased feeling in them. You may not notice minor injuries to your feet that could lead to infections or more serious problems. Taking care of your feet is one of the most important things you can do for yourself.  HOME CARE INSTRUCTIONS  Wear shoes at all times, even in the house. Do not go barefoot. Bare feet are easily injured.  Check your feet daily for blisters, cuts, and redness. If you cannot see the bottom of your feet, use a mirror or ask someone for help.  Wash your feet with warm water (do not use hot water) and mild soap. Then pat your feet and the areas between your toes until they are completely dry. Do not soak your feet as this can dry your skin.  Apply a moisturizing lotion or petroleum jelly (that does not contain alcohol and is unscented) to the skin on your feet and to dry, brittle toenails. Do not apply lotion between your toes.  Trim your toenails straight across. Do not dig under them or around the cuticle. File the edges of your nails with an emery board or nail file.  Do not cut corns or calluses or try to remove them with medicine.  Wear clean socks or stockings every day. Make sure they are not too tight. Do not wear knee-high stockings since they may decrease blood flow to your legs.  Wear shoes that fit properly and have enough cushioning. To break in new shoes, wear them for just a few hours a day. This prevents you from injuring your feet. Always look in your shoes before you put them on to be sure there are no objects inside.  Do not cross your legs. This may decrease the blood flow to your feet.  If you find a minor scrape,  cut, or break in the skin on your feet, keep it and the skin around it clean and dry. These areas may be cleansed with mild soap and water. Do not cleanse the area with peroxide, alcohol, or iodine.  When you remove an adhesive bandage, be sure not to damage the skin around it.  If you have a wound, look at it several times a day to make sure it is healing.  Do not use heating pads or hot water bottles. They may burn your skin. If you have lost feeling in your feet or legs, you may not know it is happening until it is too late.  Make sure your health care provider performs a complete foot exam at least annually or more often if you have foot problems. Report any cuts, sores, or bruises to your health care provider immediately. SEEK MEDICAL CARE IF:   You have an injury that is not healing.  You have cuts or breaks in the skin.  You have an ingrown nail.  You notice redness on your legs or feet.  You feel burning or tingling in your legs or feet.  You have pain or cramps in your legs and feet.  Your legs or feet are numb.  Your feet always feel cold. SEEK IMMEDIATE MEDICAL CARE IF:   There is increasing redness,   swelling, or pain in or around a wound.  There is a red line that goes up your leg.  Pus is coming from a wound.  You develop a fever or as directed by your health care provider.  You notice a bad smell coming from an ulcer or wound. Document Released: 02/03/2000 Document Revised: 10/08/2012 Document Reviewed: 07/15/2012 Exit[employer] Patient Information 2015 [employer]. This information is not intended to replace advice given to you by your health care provider. Make sure you discuss any questions you have with your health care provider.  

## 2014-11-14 NOTE — Discharge Summary (Signed)
Physician Discharge Summary  Darren Skinner ZOX:[id number] DOB: 1960/10/06 DOA: 11/08/2014  PCP:  Duane Lope, MD  Admit date: 11/08/2014 Discharge date: 11/14/2014  Time spent: 45 minutes  Recommendations for Outpatient Follow-up:  Patient will be discharged to home with home health for wound care.  Patient will need to follow up with primary care provider within one week of discharge, repeat CBC and BMP.  Follow up with Dr. Dorene Grebe, orthopedics.  Patient should continue medications as prescribed.  Patient should follow a carb modified diet.    Discharge Diagnoses:  Sepsis secondary to left diabetic foot ulcer/abscess/gangrenous left toe Diabetes mellitus, type II with neuropathy Tobacco use disorder Pancytopenia Hypokalemia Hypothyroidism  Discharge Condition: Stable  Diet recommendation: Carb modified  Filed Weights   11/10/14 0318 11/11/14 0500 11/12/14 0547  Weight: 98.884 kg (218 lb) 97.7 kg (215 lb 6.2 oz) 98.6 kg (217 lb 6 oz)    History of present illness:  on 11/08/2014 by Dr. Judie Grieve is a 54 y.o. male with Past medical history of hypothyroidism, diabetes mellitus with neuropathy, mood disorder. The patient is presenting with complaints of infection of the left toe. Patient mentions that he he has chronic callus which she picks on and off. One month ago he picked his callus on the left foot and later on he started having redness which was followed by increasing swelling. This was followed by increasing pain while emulating over last 1 week. Therefore he decided to go to see his PCP. In the office he was told that he may have to have his toe amputated and related to go to ER for further workup. Despite treatment at home the patient's pain was not improving and therefore he decided to come to the hospital. He denies any fall trauma or injury denies having any fever or chills no chest pain and abdominal pain. He complains of leg tenderness in the left leg. He  denies having any burning urination diarrhea or constipation. The patient is coming from home And is independent for most of his ADL; manages his medication on his own.  Hospital Course:  Sepsis secondary to left diabetic foot ulcer/abscess/gangrenous left toe -Patient continues to be afebrile with no leukocytosis -MRI shows sialitis of the first great toe with soft tissue ulceration along the medial aspect, no osteomyelitis -Orthopedic surgery consulted and appreciated, s/p I&D of left great toe -PT: no PT follow up recommended -Initially placed on vancomycin and Zosyn. Transitioned to to Augmentin BID- will need for 14days -Dressing change today, with MWF packing and dressing changes -blood cultures negative to date -Wound culture: no organisms seen  Diabetes mellitus, type II with neuropathy -Hemoglobin A1c 8.1 -During hospitalization, patient was placed on insulin sliding scale with CBG monitoring -Continue gabapentin -Had long discussion regarding diabetes management with patient, he will speak with his primary care physician. Continue glyburide and metformin at discharge  Tobacco use disorder -Counseled regarding smoking cessation  Pancytopenia -TSH within normal limits, HIV nonreactive -Improving, likely secondary to sepsis -Hemoglobin remained stable, 11.3  -Repeat CBC in 1 week   Hypokalemia -Resolved -repeat BMP in 1 week   Hypothyroidism -Continue Synthroid  Procedures  I&D   Consults  Orthopedic surgery  Discharge Exam: Filed Vitals:   11/14/14 0543  BP: 118/74  Pulse: 56  Temp: 97.9 F (36.6 C)  Resp: 18   Exam  General: Well developed, well nourished, NAD  HEENT: NCAT, mucous membranes moist.   Cardiovascular: S1 S2 auscultated, RRR,  no m/r/g  Respiratory: Clear to auscultation  Abdomen: Soft, nontender, nondistended, + bowel sounds  Extremities: warm dry without cyanosis clubbing, or edema. Erythema improved. Dressing on left great  toe.  Neuro: AAOx3, nonfocal  Psych: Normal affect and demeanor, pleasant  Discharge Instructions      Discharge Instructions    Discharge instructions    Complete by:  As directed   Patient will be discharged to home with home health for wound care.  Patient will need to follow up with primary care provider within one week of discharge, repeat CBC and BMP.  Follow up with Dr. Dorene Grebe, orthopedics.  Patient should continue medications as prescribed.  Patient should follow a carb modified diet.            Medication List    TAKE these medications        amoxicillin-clavulanate 875-125 MG per tablet  Commonly known as:  AUGMENTIN  Take 1 tablet by mouth every 12 (twelve) hours.     DULoxetine 60 MG capsule  Commonly known as:  CYMBALTA  Take 60 mg by mouth daily.     gabapentin 800 MG tablet  Commonly known as:  NEURONTIN  Take 800 mg by mouth 3 (three) times daily.     glimepiride 2 MG tablet  Commonly known as:  AMARYL  Take 2 mg by mouth daily with breakfast.     HYDROcodone-acetaminophen 5-325 MG per tablet  Commonly known as:  NORCO/VICODIN  Take 1 tablet by mouth every 4 (four) hours as needed for moderate pain.     levothyroxine 175 MCG tablet  Commonly known as:  SYNTHROID, LEVOTHROID  Take 175 mcg by mouth daily before breakfast.     metFORMIN 500 MG 24 hr tablet  Commonly known as:  GLUCOPHAGE-XR  Take 100 mg by mouth 2 (two) times daily.     valACYclovir 500 MG tablet  Commonly known as:  VALTREX  Take 500 mg by mouth daily.       No Known Allergies Follow-up Information    Follow up with [facility].   Why:  They will contact you to schedule home nursing visits.   Contact information:   [address] [place] [address] [phone]       Follow up with  Duane Lope, MD. Schedule an appointment as soon as possible for a visit in 1 week.   Specialty:  Family Medicine   Why:  Hospital follow up   Contact  information:   [address] [place] [address] [phone]       Follow up with Cammy Copa, MD. Schedule an appointment as soon as possible for a visit in 1 week.   Specialty:  Orthopedic Surgery   Why:  Hospital follow up   Contact information:   [address] [place] [address] [phone]        The results of significant diagnostics from this hospitalization (including imaging, microbiology, ancillary and laboratory) are listed below for reference.    Significant Diagnostic Studies: Mr Foot Left W Wo Contrast  11/10/2014   CLINICAL DATA:  Toe infection, ulceration, drainage. History diabetes.  EXAM: MRI OF THE LEFT FOREFOOT WITHOUT AND WITH CONTRAST  TECHNIQUE: Multiplanar, multisequence MR imaging was performed both before and after administration of intravenous contrast.  CONTRAST:  20 mL MultiHance  COMPARISON:  None.  FINDINGS: There is soft tissue ulceration over the medial aspect of the first distal phalanx with surrounding soft  tissue edema and enhancement. 2.5 x 1 cm fluid collection along the plantar aspect of the first distal phalanx consistent with an abscess. 6 mm fluid collection along the dorsal aspect of the great toe likely representing a small abscess. There is no significant marrow signal abnormality. There is no cortical destruction. There is no joint effusion.  There is no other marrow signal abnormality. There is no acute fracture or dislocation. There is mild osteoarthritis of the first MTP joint.  IMPRESSION: 1. Cellulitis of the first great toe with soft tissue ulceration along the medial aspect. No evidence of osteomyelitis of the first great toe. 2.5 x 1 cm abscess along the plantar aspect of the first distal phalanx. 6 mm abscess along the dorsal aspect of the great toe.   Electronically Signed   By: Elige Ko   On: 11/10/2014 13:01   Dg Toe Great Left  11/08/2014   CLINICAL DATA:  54 year old with diabetic ulcer involving the  left great toe which began approximately 2 months ago.  EXAM: LEFT GREAT TOE 3 VIEWS  COMPARISON:  None.  FINDINGS: Marked diffuse soft tissue swelling. Ulceration involving the plantar surface. No evidence of osteomyelitis. No evidence of acute or subacute fracture or dislocation. Well preserved joint spaces. Well preserved bone mineral density.  IMPRESSION: Soft tissue swelling and ulceration.  No osseous abnormality.   Electronically Signed   By: Hulan Saas M.D.   On: 11/08/2014 19:52    Microbiology: Recent Results (from the past 240 hour(s))  Blood Culture (routine x 2)     Status: None   Collection Time: 11/08/14 12:23 PM  Result Value Ref Range Status   Specimen Description BLOOD LEFT FOREARM  Final   Special Requests BOTTLES DRAWN AEROBIC AND ANAEROBIC 10CC  Final   Culture  Setup Time   Final    GRAM POSITIVE COCCI IN CHAINS IN BOTH AEROBIC AND ANAEROBIC BOTTLES CRITICAL RESULT CALLED TO, READ BACK BY AND VERIFIED WITH: M YIN  11/09/14 MKELLY    Culture   Final    VIRIDANS STREPTOCOCCUS THE SIGNIFICANCE OF ISOLATING THIS ORGANISM FROM A SINGLE SET OF BLOOD CULTURES WHEN MULTIPLE SETS ARE DRAWN IS UNCERTAIN. PLEASE NOTIFY THE MICROBIOLOGY DEPARTMENT WITHIN ONE WEEK IF SPECIATION AND SENSITIVITIES ARE REQUIRED.    Report Status 11/12/2014 FINAL  Final  Urine culture     Status: None   Collection Time: 11/08/14  6:55 PM  Result Value Ref Range Status   Specimen Description URINE, CLEAN CATCH  Final   Special Requests NONE  Final   Culture NO GROWTH 1 DAY  Final   Report Status 11/09/2014 FINAL  Final  Blood Culture (routine x 2)     Status: None   Collection Time: 11/08/14 10:14 PM  Result Value Ref Range Status   Specimen Description BLOOD RIGHT FOREARM  Final   Special Requests BOTTLES DRAWN AEROBIC AND ANAEROBIC 10CC  Final   Culture NO GROWTH 5 DAYS  Final   Report Status 11/13/2014 FINAL  Final  Culture, blood (routine x 2)     Status: None (Preliminary  result)   Collection Time: 11/09/14 12:35 PM  Result Value Ref Range Status   Specimen Description BLOOD RIGHT ARM  Final   Special Requests BOTTLES DRAWN AEROBIC AND ANAEROBIC 7CC  Final   Culture NO GROWTH 4 DAYS  Final   Report Status PENDING  Incomplete  Culture, blood (routine x 2)     Status: None (Preliminary result)   Collection Time:  11/09/14 12:45 PM  Result Value Ref Range Status   Specimen Description BLOOD RIGHT HAND  Final   Special Requests BOTTLES DRAWN AEROBIC ONLY  3CC  Final   Culture NO GROWTH 4 DAYS  Final   Report Status PENDING  Incomplete  MRSA PCR Screening     Status: None   Collection Time: 11/09/14  2:13 PM  Result Value Ref Range Status   MRSA by PCR NEGATIVE NEGATIVE Final    Comment:        The GeneXpert MRSA Assay (FDA approved for NASAL specimens only), is one component of a comprehensive MRSA colonization surveillance program. It is not intended to diagnose MRSA infection nor to guide or monitor treatment for MRSA infections.   Anaerobic culture     Status: None (Preliminary result)   Collection Time: 11/12/14 12:07 AM  Result Value Ref Range Status   Specimen Description WOUND LEFT TOE  Final   Special Requests NONE  Final   Gram Stain   Final    RARE WBC PRESENT,BOTH PMN AND MONONUCLEAR NO SQUAMOUS EPITHELIAL CELLS SEEN NO ORGANISMS SEEN Performed at [facility]    Culture   Final    NO ANAEROBES ISOLATED; CULTURE IN PROGRESS FOR 5 DAYS Performed at [facility]    Report Status PENDING  Incomplete  Wound culture     Status: None (Preliminary result)   Collection Time: 11/12/14 12:07 AM  Result Value Ref Range Status   Specimen Description WOUND LEFT TOE  Final   Special Requests NONE  Final   Gram Stain   Final    RARE WBC PRESENT, PREDOMINANTLY PMN NO SQUAMOUS EPITHELIAL CELLS SEEN NO ORGANISMS SEEN Performed at [facility]    Culture   Final    RARE STAPHYLOCOCCUS AUREUS Note: Susceptibilities  to follow Performed at [facility]    Report Status PENDING  Incomplete     Labs: Basic Metabolic Panel:  Recent Labs Lab 11/09/14 0600 11/09/14 1235 11/10/14 1422 11/11/14 0417 11/12/14 0439 11/13/14 0536  NA 134*  --  132* 135 137 141  K 3.4*  --  3.3* 3.4* 3.8 4.0  CL 101  --  101 103 101 103  CO2 24  --  25 26 29 30   GLUCOSE 227*  --  212* 194* 274* 168*  BUN 14  --  10 8 9 9   CREATININE 1.07  --  0.86 0.72 0.83 0.86  CALCIUM 8.4*  --  8.3* 8.7* 8.8* 9.1  MG  --  1.4*  --  1.6*  --   --    Liver Function Tests:  Recent Labs Lab 11/08/14 1225 11/09/14 0600  AST 19 32  ALT 18 23  ALKPHOS 70 68  BILITOT 0.9 1.6*  PROT 7.4 6.7  ALBUMIN 4.0 3.0*    Recent Labs Lab 11/08/14 1225  LIPASE 28   No results for input(s): AMMONIA in the last 168 hours. CBC:  Recent Labs Lab 11/08/14 1225 11/09/14 0600 11/09/14 1235 11/11/14 0417 11/12/14 0439 11/13/14 0536  WBC 9.3 12.2* 3.6* 7.6 10.1 7.6  NEUTROABS 8.4* 11.2*  --   --   --   --   HGB 13.5 11.3* 12.3* 10.8* 10.8* 11.3*  HCT 38.5* 33.0* 35.3* 29.8* 30.7* 32.0*  MCV 88.5 88.9 88.9 86.1 88.2 89.1  PLT 168 142* 140* 147* 174 188   Cardiac Enzymes:  Recent Labs Lab 11/09/14 0042 11/09/14 1235 11/09/14 1630 11/09/14 2215 11/10/14 0508  CKTOTAL  63  --   --   --   --   TROPONINI  --  <0.03  <0.03 0.04* <0.03 0.03   BNP: BNP (last 3 results) No results for input(s): BNP in the last 8760 hours.  ProBNP (last 3 results) No results for input(s): PROBNP in the last 8760 hours.  CBG:  Recent Labs Lab 11/13/14 0628 11/13/14 1122 11/13/14 1609 11/13/14 2205 11/14/14 0618  GLUCAP 149* 218* 232* 175* 153*       Signed:  Edsel Petrin  [facility] 11/14/2014, 9:42 AM

## 2014-11-16 LAB — WOUND CULTURE

## 2014-11-17 LAB — ANAEROBIC CULTURE

## 2014-12-06 ENCOUNTER — Encounter (HOSPITAL_COMMUNITY): Payer: Self-pay | Admitting: Internal Medicine

## 2015-01-25 ENCOUNTER — Encounter (HOSPITAL_COMMUNITY): Payer: Self-pay | Admitting: Emergency Medicine

## 2015-01-25 ENCOUNTER — Emergency Department (HOSPITAL_COMMUNITY): Payer: Self-pay

## 2015-01-25 ENCOUNTER — Emergency Department (HOSPITAL_COMMUNITY)
Admission: EM | Admit: 2015-01-25 | Discharge: 2015-01-25 | Disposition: A | Discharge status: Home / Self Care | Payer: Self-pay | Attending: Emergency Medicine | Admitting: Emergency Medicine

## 2015-01-25 DIAGNOSIS — E079 Disorder of thyroid, unspecified: Secondary | ICD-10-CM | POA: Insufficient documentation

## 2015-01-25 DIAGNOSIS — Z7984 Long term (current) use of oral hypoglycemic drugs: Secondary | ICD-10-CM | POA: Insufficient documentation

## 2015-01-25 DIAGNOSIS — E1149 Type 2 diabetes mellitus with other diabetic neurological complication: Secondary | ICD-10-CM | POA: Insufficient documentation

## 2015-01-25 DIAGNOSIS — E13621 Other specified diabetes mellitus with foot ulcer: Secondary | ICD-10-CM | POA: Insufficient documentation

## 2015-01-25 DIAGNOSIS — L97509 Non-pressure chronic ulcer of other part of unspecified foot with unspecified severity: Secondary | ICD-10-CM | POA: Diagnosis present

## 2015-01-25 DIAGNOSIS — E11621 Type 2 diabetes mellitus with foot ulcer: Secondary | ICD-10-CM | POA: Diagnosis present

## 2015-01-25 DIAGNOSIS — L03115 Cellulitis of right lower limb: Secondary | ICD-10-CM | POA: Insufficient documentation

## 2015-01-25 DIAGNOSIS — S91309A Unspecified open wound, unspecified foot, initial encounter: Secondary | ICD-10-CM | POA: Insufficient documentation

## 2015-01-25 DIAGNOSIS — F17210 Nicotine dependence, cigarettes, uncomplicated: Secondary | ICD-10-CM | POA: Insufficient documentation

## 2015-01-25 DIAGNOSIS — S91302A Unspecified open wound, left foot, initial encounter: Secondary | ICD-10-CM

## 2015-01-25 DIAGNOSIS — L97529 Non-pressure chronic ulcer of other part of left foot with unspecified severity: Secondary | ICD-10-CM | POA: Insufficient documentation

## 2015-01-25 DIAGNOSIS — Z79899 Other long term (current) drug therapy: Secondary | ICD-10-CM | POA: Insufficient documentation

## 2015-01-25 DIAGNOSIS — E08621 Diabetes mellitus due to underlying condition with foot ulcer: Secondary | ICD-10-CM

## 2015-01-25 DIAGNOSIS — G629 Polyneuropathy, unspecified: Secondary | ICD-10-CM | POA: Insufficient documentation

## 2015-01-25 DIAGNOSIS — E1140 Type 2 diabetes mellitus with diabetic neuropathy, unspecified: Secondary | ICD-10-CM

## 2015-01-25 LAB — CBC
HCT: 38 % — ABNORMAL LOW (ref 39.0–52.0)
Hemoglobin: 13.1 g/dL (ref 13.0–17.0)
MCH: 30.7 pg (ref 26.0–34.0)
MCHC: 34.5 g/dL (ref 30.0–36.0)
MCV: 89 fL (ref 78.0–100.0)
PLATELETS: 158 10*3/uL (ref 150–400)
RBC: 4.27 MIL/uL (ref 4.22–5.81)
RDW: 12.4 % (ref 11.5–15.5)
WBC: 5.7 10*3/uL (ref 4.0–10.5)

## 2015-01-25 LAB — COMPREHENSIVE METABOLIC PANEL
ALT: 27 U/L (ref 17–63)
ANION GAP: 11 (ref 5–15)
AST: 37 U/L (ref 15–41)
Albumin: 3.6 g/dL (ref 3.5–5.0)
Alkaline Phosphatase: 75 U/L (ref 38–126)
BUN: 13 mg/dL (ref 6–20)
CHLORIDE: 103 mmol/L (ref 101–111)
CO2: 25 mmol/L (ref 22–32)
Calcium: 9.7 mg/dL (ref 8.9–10.3)
Creatinine, Ser: 0.84 mg/dL (ref 0.61–1.24)
Glucose, Bld: 134 mg/dL — ABNORMAL HIGH (ref 65–99)
POTASSIUM: 4.7 mmol/L (ref 3.5–5.1)
SODIUM: 139 mmol/L (ref 135–145)
Total Bilirubin: 0.9 mg/dL (ref 0.3–1.2)
Total Protein: 6.9 g/dL (ref 6.5–8.1)

## 2015-01-25 MED ORDER — PIPERACILLIN-TAZOBACTAM 3.375 G IVPB 30 MIN
3.3750 g | Freq: Once | INTRAVENOUS | Status: AC
  Administered 2015-01-25: 3.375 g via INTRAVENOUS
  Filled 2015-01-25: qty 50

## 2015-01-25 MED ORDER — CIPROFLOXACIN IN D5W 400 MG/200ML IV SOLN: 400.0000 mg | Freq: Once | INTRAVENOUS | Status: DC

## 2015-01-25 MED ORDER — SILVER SULFADIAZINE 1 % EX CREA: 1.0000 "application " | TOPICAL_CREAM | Freq: Every day | CUTANEOUS | Status: DC

## 2015-01-25 MED ORDER — AMOXICILLIN-POT CLAVULANATE 875-125 MG PO TABS: 1.0000 | ORAL_TABLET | Freq: Two times a day (BID) | ORAL | Status: DC

## 2015-01-25 NOTE — Discharge Instructions (Signed)
1) Begin Augmentin 875/125 BID x 14 days 2) Keep previously scheduled appointment with Dr. 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 This may cause the skin on your feet to become thinner, break easier, and heal more slowly. Your skin may become dry, and the skin may peel and crack. You may also have nerve damage in your legs and feet causing decreased feeling in them. You may not notice minor injuries to your feet that could lead to infections or more serious problems. Taking care of your feet is one of the most important things you can do for yourself.  HOME CARE INSTRUCTIONS  Wear shoes at all times, even in the house. Do not go barefoot. Bare feet are easily injured.  Check your feet daily for blisters, cuts, and redness. If you cannot see the bottom of your feet, use a mirror or ask someone for help.  Wash your feet with warm water (do not use hot water) and mild soap. Then pat your feet and the areas between your toes until they are completely dry. Do not soak your feet as this can dry your skin.  Apply a moisturizing lotion or petroleum jelly (that does not contain alcohol and is unscented) to the skin on your feet and to dry, brittle toenails. Do not apply  lotion between your toes.  Trim your toenails straight across. Do not dig under them or around the cuticle. File the edges of your nails with an emery board or nail file.  Do not cut corns or calluses or try to remove them with medicine.  Wear clean socks or stockings every day. Make sure they are not too tight. Do not wear knee-high stockings since they may decrease blood flow to your legs.  Wear shoes that fit properly and have enough cushioning. To break in new shoes, wear them for just a few hours a day. This prevents you from injuring your feet. Always look in your shoes before you put them on to be sure there are no objects inside.  Do not cross your legs. This may decrease the blood flow to your feet.  If you find a minor scrape, cut, or break in the skin on your feet, keep it and the skin around it clean and dry. These areas may be cleansed with mild soap and water. Do not cleanse the area with peroxide, alcohol, or iodine.  When you remove an adhesive bandage, be sure not to damage the skin around it.  If you have a wound, look at it several times a day to make sure it is healing.  Do not use heating pads or hot water bottles. They may burn your skin. If you have lost feeling in your feet or legs, you may not  know it is happening until it is too late.  Make sure your health care provider performs a complete foot exam at least annually or more often if you have foot problems. Report any cuts, sores, or bruises to your health care provider immediately. SEEK MEDICAL CARE IF:   You have an injury that is not healing.  You have cuts or breaks in the skin.  You have an ingrown nail.  You notice redness on your legs or feet.  You feel burning or tingling in your legs or feet.  You have pain or cramps in your legs and feet.  Your legs or feet are numb.  Your feet always feel cold. SEEK IMMEDIATE MEDICAL CARE IF:   There is increasing redness, swelling, or pain in or around a  wound.  There is a red line that goes up your leg.  Pus is coming from a wound.  You develop a fever or as directed by your health care provider.  You notice a bad smell coming from an ulcer or wound.   This information is not intended to replace advice given to you by your health care provider. Make sure you discuss any questions you have with your health care provider.   Document Released: 02/03/2000 Document Revised: 10/08/2012 Document Reviewed: 07/15/2012 Elsevier Interactive Patient Education [employer]

## 2015-01-25 NOTE — ED Provider Notes (Signed)
Patient seen and evaluated. Exam shows cellulitis and possible immediate subcutaneous bullae versus immediate subcutaneous abscess. His proximal lymphangitis from the arch of the foot to just below the knee. Does not had fever shakes chills riders. Does not clinically appear septic or toxic. We discussed admission for IV antibiotics. X-ray shows no gross bony involvement. Does have also an ulceration on his great toe. Has been present since an admission several months ago. I agree with the assessment and plan per Will Danise PA-C.  Rolland PorterMark Marjarie Irion, MD 01/25/15 [phone]

## 2015-01-25 NOTE — ED Provider Notes (Signed)
CSN: [id number]     Arrival date & time 01/25/15  1040 History   First MD Initiated Contact with Patient 01/25/15 1154     Chief Complaint  Patient presents with  . Wound Infection   Darren Skinner is a 54 y.o. male with a history of  Diabetes and neuropathy presents to the emergency department complaining of a left  Foot infection. The patient ports he first noticed redness and swelling to his left foot 4 days ago. Patient reports had a similar incident earlier this year and had to have an incision and drainage of his left great toe. He sells surgeon Dr. August Saucer. Patient reports he has 6 out of 10 pain which is unchanged from his normal neuropathy pain. He denies any fevers. He has taken nothing for treatment today. He reports he is unable to get an appointment with Dr. August Saucer. Patient denies fevers, chills, new numbness or tingling, weakness, abdominal pain, nausea, vomiting, or discharge from his foot.  (Consider location/radiation/quality/duration/timing/severity/associated sxs/prior Treatment) HPI  Past Medical History  Diagnosis Date  . Thyroid disease   . Diabetes mellitus without complication (HCC)   . Neuropathy [facility])    Past Surgical History  Procedure Laterality Date  . Joint replacement      broken arm   . Amputation Left 11/11/2014    Procedure: Incision and drainage of left great toe;  Surgeon: Cammy Copa, MD;  Location: [facility] OR;  Service: Orthopedics;  Laterality: Left;   No family history on file. Social History  Substance Use Topics  . Smoking status: Current Every Day Smoker -- 1.00 packs/day    Types: Cigarettes  . Smokeless tobacco: None  . Alcohol Use: No    Review of Systems  Constitutional: Negative for fever and chills.  HENT: Negative for congestion and sore throat.   Eyes: Negative for visual disturbance.  Respiratory: Negative for cough and shortness of breath.   Cardiovascular: Negative for chest pain.  Gastrointestinal: Negative for nausea, vomiting,  abdominal pain and diarrhea.  Genitourinary: Negative for dysuria.  Musculoskeletal: Positive for myalgias and arthralgias. Negative for back pain and neck pain.  Skin: Positive for color change and wound. Negative for rash.  Neurological: Negative for weakness and headaches.      Allergies  Review of patient's allergies indicates no known allergies.  Home Medications   Prior to Admission medications   Medication Sig Start Date End Date Taking? Authorizing Provider  b complex vitamins tablet Take 1 tablet by mouth daily.   Yes Historical Provider, MD  DULoxetine (CYMBALTA) 60 MG capsule Take 60 mg by mouth 2 (two) times daily.    Yes Historical Provider, MD  gabapentin (NEURONTIN) 800 MG tablet Take 800 mg by mouth 3 (three) times daily.   Yes Historical Provider, MD  glimepiride (AMARYL) 2 MG tablet Take 2 mg by mouth daily with breakfast.   Yes Historical Provider, MD  l-methylfolate-B6-B12 (METANX) 3-35-2 MG TABS tablet Take 1 tablet by mouth 2 (two) times daily.   Yes Historical Provider, MD  levothyroxine (SYNTHROID, LEVOTHROID) 175 MCG tablet Take 175 mcg by mouth daily before breakfast.   Yes Historical Provider, MD  metFORMIN (GLUCOPHAGE-XR) 500 MG 24 hr tablet Take 1,000 mg by mouth 2 (two) times daily.    Yes Historical Provider, MD  amoxicillin-clavulanate (AUGMENTIN) 875-125 MG tablet Take 1 tablet by mouth every 12 (twelve) hours. 01/25/15   Everlene Farrier, PA-C  HYDROcodone-acetaminophen (NORCO/VICODIN) 5-325 MG per tablet Take 1 tablet by mouth every  4 (four) hours as needed for moderate pain. Patient not taking: Reported on 01/25/2015 11/14/14   Nita SellsMaryann Mikhail, DO  silver sulfADIAZINE (SILVADENE) 1 % cream Apply 1 application topically daily. 01/25/15   Everlene FarrierWilliam Solenne Manwarren, PA-C   BP 119/81 mmHg  Pulse 66  Temp(Src) 97.6 F (36.4 C) (Oral)  Resp 16  SpO2 97% Physical Exam  Constitutional: He is oriented to person, place, and time. He appears well-developed and  well-nourished. No distress.   Nontoxic appearing.  HENT:  Head: Normocephalic and atraumatic.  Mouth/Throat: Oropharynx is clear and moist.  Eyes: Conjunctivae are normal. Pupils are equal, round, and reactive to light. Right eye exhibits no discharge. Left eye exhibits no discharge.  Neck: Neck supple.  Cardiovascular: Normal rate, regular rhythm, normal heart sounds and intact distal pulses.  Exam reveals no gallop and no friction rub.   Bilateral radial, posterior tibialis and dorsalis pedis pulses are intact.   Good capillary refill to his left distal toes.  Pulmonary/Chest: Effort normal and breath sounds normal. No respiratory distress. He has no wheezes. He has no rales.  Abdominal: Soft. There is no tenderness.  Musculoskeletal: He exhibits edema and tenderness.   There is mild edema and moderate erythema to the patient's left foot that is starting to streak up his leg. There is ecchymosis to the plantar aspect of his left foot with blistering. There appears to be a healing ulcer to his left great toe. His left foot is warm to touch. No discharge. Left leg compartments feel soft.   Lymphadenopathy:    He has no cervical adenopathy.  Neurological: He is alert and oriented to person, place, and time. Coordination normal.   He reports sensation is intact to his left foot however, diminished.  Skin: Skin is warm and dry. No rash noted. He is not diaphoretic. There is erythema. No pallor.  Psychiatric: He has a normal mood and affect. His behavior is normal.  Nursing note and vitals reviewed.   ED Course  Procedures (including critical care time) Labs Review Labs Reviewed  CBC - Abnormal; Notable for the following:    HCT 38.0 (*)    All other components within normal limits  COMPREHENSIVE METABOLIC PANEL - Abnormal; Notable for the following:    Glucose, Bld 134 (*)    All other components within normal limits    Imaging Review Dg Foot Complete Left  01/25/2015  CLINICAL  DATA:  54 year old male with soft tissue wound plantar surface of the foot discovered 3 days ago. Diabetes. Initial encounter. EXAM: LEFT FOOT - COMPLETE 3+ VIEW COMPARISON:  Left great toe series 11/08/14. FINDINGS: Left great toe wound still partially visible. No soft tissue emphysema. Bone mineralization is within normal limits. Calcaneus intact. No acute fracture, dislocation, or cortical osteolysis. Joint spaces appear stable. IMPRESSION: No subcutaneous gas or acute osseous abnormality identified in the left foot. Electronically Signed   By: Odessa FlemingH  Hall M.D.   On: 01/25/2015 12:47   I have personally reviewed and evaluated these images and lab results as part of my medical decision-making.   EKG Interpretation None      Filed Vitals:   01/25/15 1400 01/25/15 1415 01/25/15 1430 01/25/15 1553  BP: 127/78 109/75 109/74 119/81  Pulse:  66  66  Temp:      TempSrc:      Resp:    16  SpO2:  98%  97%     MDM   Meds given in ED:  Medications  piperacillin-tazobactam (ZOSYN)  IVPB 3.375 g (0 g Intravenous Stopped 01/25/15 1600)    Discharge Medication List as of 01/25/2015  4:10 PM    START taking these medications   Details  amoxicillin-clavulanate (AUGMENTIN) 875-125 MG tablet Take 1 tablet by mouth every 12 (twelve) hours., Starting 01/25/2015, Until Discontinued, Print    silver sulfADIAZINE (SILVADENE) 1 % cream Apply 1 application topically daily., Starting 01/25/2015, Until Discontinued, Print        Final diagnoses:  Other specified diabetes mellitus with foot ulcer, unspecified long term insulin use status (HCC)   This is a 54 y.o. male with a history of  Diabetes and neuropathy presents to the emergency department complaining of a left  Foot infection. The patient ports he first noticed redness and swelling to his left foot 4 days ago. Patient reports had a similar incident earlier this year and had to have an incision and drainage of his left great toe. He sells surgeon Dr. August Saucer.  Patient reports he has 6 out of 10 pain which is unchanged from his normal neuropathy pain. He denies any fevers.   On exam the patient is afebrile nontoxic appearing. Exam shows cellulitis of his left foot with possible immediate subcutaneous blisters versus  subcutaneous abscess.  X-ray shows no bony involvement. Blood work is unremarkable. Plan is for IV antibiotics and admission.    the admitting team came to the patient's bedside to evaluate the patient and declined admission. They would like the patient to be discharged home with Augmentin and to keep his appointment for follow-up with Dr. August Saucer. I still encourage admission.   Patient received IV Zosyn prior to discharge. Patient discharged with Augmentin and advised to keep his follow-up with Dr. August Saucer.  Specific care instructions were provided by admission team prior to discharge. I discussed strict return precautions to the emergency department. I advised the patient to follow-up with their primary care provider this week. I advised the patient to return to the emergency department with new or worsening symptoms or new concerns. The patient verbalized understanding and agreement with plan.     This patient was discussed with and evaluated by Dr. Fayrene Fearing who presents with assessment and plan.  Everlene Farrier, PA-C 01/25/15 1757  Rolland Porter, MD 02/02/15 [phone]

## 2015-01-25 NOTE — ED Notes (Signed)
Pt c/o wound on left foot-- has had diabetic ulcer in past on same foot.

## 2015-01-25 NOTE — Consult Note (Signed)
[facility] ER Consultation Note                                                                                    Darren Skinner, is a 55 y.o. male  MRN: [id number]   DOB - 20-Feb-1961  Admit Date - 01/25/2015  Outpatient Primary MD for the patient is  Duane Lope, MD  Referring MD: Fayrene Fearing / ER  With History of -  Past Medical History  Diagnosis Date  . Thyroid disease   . Diabetes mellitus without complication (HCC)   . Neuropathy [facility])       Past Surgical History  Procedure Laterality Date  . Joint replacement      broken arm   . Amputation Left 11/11/2014    Procedure: Incision and drainage of left great toe;  Surgeon: Cammy Copa, MD;  Location: [facility] OR;  Service: Orthopedics;  Laterality: Left;    in for   Chief Complaint  Patient presents with  . Wound Infection     HPI This is a 54 year old male patient with known history of diabetes on oral agents. He was discharged on 9/25 after an admission for sepsis secondary to left diabetic foot ulcer with associated abscess and gangrenous left toe. Orthopedic surgery was consulted and patient subsequently underwent I/D of left great toe. He was initially treated with Zosyn and vancomycin and transitioned to Augmentin for 14 days. Since that admission, although he continues with a left plantar aspect great toe ulcer with callus he has had no further issues with infectious symptomatology. 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 Over the past 4 days patient has noted redness with focal swelling to the plantar aspect of his left foot. He is insensate in the left foot. His had no drainage. He has had no fevers or chills. His sugars have remained well controlled. He reports his most recent hemoglobin A1c was 5.8. He has stable neuropathic discomfort but no pain related to the area in his foot. He was unable to get an appointment with his orthopedic physician Dr. August Saucer until this Thursday. He presented to  the ER for evaluation.   Review of Systems   In addition to the HPI above,  No Fever-chills, myalgias or other constitutional symptoms No Headache, changes with Vision or hearing, new weakness, tingling, numbness in any extremity, No problems swallowing food or Liquids, indigestion/reflux No Chest pain, Cough or Shortness of Breath, palpitations, orthopnea or DOE No Abdominal pain, N/V; no melena or hematochezia, no dark tarry stools, Bowel movements are regular, No dysuria, hematuria or flank pain No masses or bruises, No recent weight gain or loss No polyuria, polydypsia or polyphagia,  *A full 10 point Review of Systems was done, except as stated above, all other Review of Systems were negative.  Social History Social History  Substance Use Topics  . Smoking status: Current Every Day Smoker -- 1.00 packs/day    Types: Cigarettes  . Smokeless tobacco: Not on file  . Alcohol Use: No    Resides at: Private residence  Lives with: Alone  Ambulatory status: Without assistive devices   Family History No  family history on file. reviewed with patient and no pertinent family history regarding diabetes.   Prior to Admission medications   Medication Sig Start Date End Date Taking? Authorizing Provider  b complex vitamins tablet Take 1 tablet by mouth daily.   Yes Historical Provider, MD  DULoxetine (CYMBALTA) 60 MG capsule Take 60 mg by mouth 2 (two) times daily.    Yes Historical Provider, MD  gabapentin (NEURONTIN) 800 MG tablet Take 800 mg by mouth 3 (three) times daily.   Yes Historical Provider, MD  glimepiride (AMARYL) 2 MG tablet Take 2 mg by mouth daily with breakfast.   Yes Historical Provider, MD  l-methylfolate-B6-B12 (METANX) 3-35-2 MG TABS tablet Take 1 tablet by mouth 2 (two) times daily.   Yes Historical Provider, MD  levothyroxine (SYNTHROID, LEVOTHROID) 175 MCG tablet Take 175 mcg by mouth daily before breakfast.   Yes Historical Provider, MD  metFORMIN  (GLUCOPHAGE-XR) 500 MG 24 hr tablet Take 1,000 mg by mouth 2 (two) times daily.    Yes Historical Provider, MD  HYDROcodone-acetaminophen (NORCO/VICODIN) 5-325 MG per tablet Take 1 tablet by mouth every 4 (four) hours as needed for moderate pain. Patient not taking: Reported on 01/25/2015 11/14/14   Edsel PetrinMaryann Mikhail, DO    No Known Allergies  Physical Exam  Vitals  Blood pressure 121/72, pulse 73, temperature 97.6 F (36.4 C), temperature source Oral, resp. rate 20, SpO2 99 %.   General:  In no acute distress, appears healthy and well nourished  Psych:  Normal affect, Denies Suicidal or Homicidal ideations, Awake Alert, Oriented X 3. Speech and thought patterns are clear and appropriate, no apparent short term memory deficits  Neuro:   No focal neurological deficits, CN II through XII intact, Strength 5/5 all 4 extremities, Sensation intact all 4 extremities.  ENT:  Ears and Eyes appear Normal, Conjunctivae clear, PER. Moist oral mucosa without erythema or exudates.  Neck:  Supple, No lymphadenopathy appreciated  Respiratory:  Symmetrical chest wall movement, Good air movement bilaterally, CTAB. Room Air  Cardiac:  RRR, No Murmurs, no LE edema noted, no JVD, No carotid bruits, peripheral pulses palpable at 2+  Abdomen:  Positive bowel sounds, Soft, Non tender, Non distended,  No masses appreciated, no obvious hepatosplenomegaly  Skin:  No Cyanosis, Normal Skin Turgor, noted with an irregular shaped erythematous region on the plantar aspect of the left foot measuring about 11-12 cm in length and 4 cm width. There are 2  vesicular formations in the central aspect of this erythematous region that are intact and are grayish in color measuring ~1.5 cm x 2 cm and about 1.5 cm x 4 cm. There is minimal swelling of the foot and lower leg below the knee. There is no streaking or cording noted. There is no tenderness in the foot noting patient is insensate but there is also no tenderness in the leg  between the ankle or the knee.  Extremities: Symmetrical without obvious trauma or injury,  left Ankle joint palpated without any effusion, crepitus or pain. The joint itself is not erythematous.  Data Review  CBC  Recent Labs Lab 01/25/15 1220  WBC 5.7  HGB 13.1  HCT 38.0*  PLT 158  MCV 89.0  MCH 30.7  MCHC 34.5  RDW 12.4    Chemistries   Recent Labs Lab 01/25/15 1220  NA 139  K 4.7  CL 103  CO2 25  GLUCOSE 134*  BUN 13  CREATININE 0.84  CALCIUM 9.7  AST 37  ALT  27  ALKPHOS 75  BILITOT 0.9    CrCl cannot be calculated (Unknown ideal weight.).  No results for input(s): TSH, T4TOTAL, T3FREE, THYROIDAB in the last 72 hours.  Invalid input(s): FREET3  Coagulation profile No results for input(s): INR, PROTIME in the last 168 hours.  No results for input(s): DDIMER in the last 72 hours.  Cardiac Enzymes No results for input(s): CKMB, TROPONINI, MYOGLOBIN in the last 168 hours.  Invalid input(s): CK  Invalid input(s): POCBNP  Urinalysis    Component Value Date/Time   COLORURINE AMBER* 11/08/2014 1855   APPEARANCEUR CLEAR 11/08/2014 1855   LABSPEC 1.029 11/08/2014 1855   PHURINE 5.5 11/08/2014 1855   GLUCOSEU NEGATIVE 11/08/2014 1855   HGBUR NEGATIVE 11/08/2014 1855   BILIRUBINUR SMALL* 11/08/2014 1855   KETONESUR 15* 11/08/2014 1855   PROTEINUR 30* 11/08/2014 1855   UROBILINOGEN 1.0 11/08/2014 1855   NITRITE NEGATIVE 11/08/2014 1855   LEUKOCYTESUR TRACE* 11/08/2014 1855    Imaging results:   Dg Foot Complete Left  01/25/2015  CLINICAL DATA:  54 year old male with soft tissue wound plantar surface of the foot discovered 3 days ago. Diabetes. Initial encounter. EXAM: LEFT FOOT - COMPLETE 3+ VIEW COMPARISON:  Left great toe series 11/08/14. FINDINGS: Left great toe wound still partially visible. No soft tissue emphysema. Bone mineralization is within normal limits. Calcaneus intact. No acute fracture, dislocation, or cortical osteolysis. Joint  spaces appear stable. IMPRESSION: No subcutaneous gas or acute osseous abnormality identified in the left foot. Electronically Signed   By: Odessa Fleming M.D.   On: 01/25/2015 12:47      Assessment & Plan  Principal Problem:   Diabetes mellitus with foot ulcer (HCC) -I have examined and interviewed the patient in the presence of my attending physician Dr. Konrad Dolores -Currently patient is afebrile, he does not have leukocytosis, he is hemodynamically stable, serum glucose 134. -Plan is to discharge patient home -Discussed at length with patient discharge plans; he is comfortable with performing wound care and has declined our offer to have home health RN visit the home and assist with wound care -We'll begin oral antibiotics and dry wound care with transition to moist to dry dressings daily once vesicles rupture. -See discharge instructions below -Patient is to continue checking CBGs as prior to admission and continue oral hypoglycemic agents   Discharge instructions: 1) Begin Augmentin 875/125 BID x 14 days 2) Keep previously scheduled appointment with Dr. 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 Shelly Flatten    Condition: Stable   Discharge disposition: To home environment  Time spent in minutes : 60      Simone Rodenbeck L. ANP on 01/25/2015 at 2:35  PM  Between 7am to 7pm - Pager - [phone]  After 7pm go to [email or web address] - password [email or web address]  And look for the night coverage person covering me after hours  [place]

## 2015-01-25 NOTE — ED Notes (Signed)
Pt given instructions on wound care, verbalizes understanding. Verbalizes understanding of discharge instructions and prescriptions. NAD. VSS. Ambulatory with steady gait.

## 2015-01-25 NOTE — Progress Notes (Signed)
Plan dc to home with the following instructions (Full consult note to follow):  1) Begin Augmentin 875/125 BID x 14 days 2) Keep previously scheduled appointment with Dr. 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

## 2015-02-20 DIAGNOSIS — Z8619 Personal history of other infectious and parasitic diseases: Secondary | ICD-10-CM

## 2015-02-20 HISTORY — DX: Personal history of other infectious and parasitic diseases: Z86.19

## 2015-09-25 ENCOUNTER — Emergency Department
Admission: EM | Admit: 2015-09-25 | Disposition: A | Discharge status: Home / Self Care | Payer: Self-pay | Source: Ambulatory Visit | Attending: Emergency Medicine | Admitting: Emergency Medicine

## 2015-09-25 ENCOUNTER — Encounter: Payer: Self-pay | Admitting: Obstetrics and Gynecology

## 2015-09-25 DIAGNOSIS — I4891 Unspecified atrial fibrillation: Secondary | ICD-10-CM

## 2015-09-25 DIAGNOSIS — E119 Type 2 diabetes mellitus without complications: Secondary | ICD-10-CM

## 2015-09-25 LAB — BASIC METABOLIC PANEL
Anion Gap: 20 — ABNORMAL HIGH (ref 7–16)
CO2: 19 mmol/L — ABNORMAL LOW (ref 20–28)
Calcium: 9.3 mg/dL (ref 8.6–10.2)
Chloride: 100 mmol/L (ref 96–108)
Creatinine: 0.93 mg/dL (ref 0.67–1.17)
GFR,Black: 106 *
GFR,Caucasian: 92 *
Glucose: 183 mg/dL — ABNORMAL HIGH (ref 60–99)
Lab: 12 mg/dL (ref 6–20)
Potassium: 3.9 mmol/L (ref 3.3–5.1)
Sodium: 139 mmol/L (ref 133–145)

## 2015-09-25 LAB — ETHANOL: Ethanol: 83 mg/dL

## 2015-09-25 LAB — HOLD LAVENDER

## 2015-09-25 LAB — HOLD GREEN WITH GEL

## 2015-09-25 MED ORDER — IBUPROFEN 400 MG PO TABS *I*
600.0000 mg | ORAL_TABLET | Freq: Once | ORAL | Status: AC
Start: 2015-09-25 — End: 2015-09-25
  Administered 2015-09-25: 600 mg via ORAL
  Filled 2015-09-25 (×2): qty 1

## 2015-09-25 MED ORDER — ONDANSETRON 4 MG PO TBDP *I*
4.0000 mg | ORAL_TABLET | Freq: Once | ORAL | Status: AC
Start: 2015-09-25 — End: 2015-09-25
  Administered 2015-09-25: 4 mg via ORAL
  Filled 2015-09-25: qty 1

## 2015-09-25 MED ORDER — TETANUS-DIPHTH-ACELL PERT 5-2.5-18.5 LF-MCG/0.5 IM SUSP *WRAPPED*
0.5000 mL | Freq: Once | INTRAMUSCULAR | Status: AC
Start: 2015-09-25 — End: 2015-09-25
  Administered 2015-09-25: 0.5 mL via INTRAMUSCULAR
  Filled 2015-09-25: qty 0.5

## 2015-09-25 NOTE — ED Notes (Signed)
Pt up and ambulatory without assistance. Pt tolerating PO intake. Pt discharge instructions reviewed. Pt comfortable with discharge planning and verbalizes understanding of discharge instruction. Pt will follow up with PCP. Pt belongings with pt. Pt safe to discharge at this time.

## 2015-09-25 NOTE — ED Notes (Signed)
MD at chairside suturing wound.
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
## 2015-09-25 NOTE — ED Provider Progress Notes (Signed)
ED Provider Progress Note    Took over pt care at sign out. Pt not intoxicated by labs, ambulating without difficulty. 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 SW in consult, will provide bus pass to get to truck and is reasonable for discharge. Patient remains hemodynamically stable, overall well appearing, and is comfortable with plan for discharge. Will recommend PCP follow up and provide return precautions.       Aletha HalimAndrew Lynn Kathaleen Dudziak, DO, 09/25/2015, 3:50 PM         Jandel Patriarca, Aletha HalimAndrew Lynn, DO  Resident  09/25/15 [phone]
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
## 2016-04-13 ENCOUNTER — Encounter (INDEPENDENT_AMBULATORY_CARE_PROVIDER_SITE_OTHER): Payer: Self-pay | Admitting: Orthopedic Surgery

## 2016-04-13 ENCOUNTER — Ambulatory Visit (INDEPENDENT_AMBULATORY_CARE_PROVIDER_SITE_OTHER): Payer: Self-pay | Admitting: Family

## 2016-04-13 ENCOUNTER — Ambulatory Visit (INDEPENDENT_AMBULATORY_CARE_PROVIDER_SITE_OTHER): Payer: Self-pay

## 2016-04-13 VITALS — Ht 74.0 in | Wt 217.0 lb

## 2016-04-13 DIAGNOSIS — E08621 Diabetes mellitus due to underlying condition with foot ulcer: Secondary | ICD-10-CM

## 2016-04-13 DIAGNOSIS — L97509 Non-pressure chronic ulcer of other part of unspecified foot with unspecified severity: Secondary | ICD-10-CM

## 2016-04-13 DIAGNOSIS — M86272 Subacute osteomyelitis, left ankle and foot: Secondary | ICD-10-CM

## 2016-04-13 DIAGNOSIS — M79675 Pain in left toe(s): Secondary | ICD-10-CM

## 2016-04-13 MED ORDER — HYDROCODONE-ACETAMINOPHEN 5-325 MG PO TABS: 1.0000 | ORAL_TABLET | Freq: Four times a day (QID) | ORAL | 0 refills | Status: DC | PRN
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
## 2016-04-16 ENCOUNTER — Other Ambulatory Visit (INDEPENDENT_AMBULATORY_CARE_PROVIDER_SITE_OTHER): Payer: Self-pay | Admitting: Family

## 2016-04-19 ENCOUNTER — Encounter (HOSPITAL_COMMUNITY): Payer: Self-pay | Admitting: *Deleted

## 2016-04-19 NOTE — Progress Notes (Signed)
Pt states he had A-fib "years ago" and has not been on medication for at least 15 years. Denies any recent chest pain. States he does have some sob due to smoking. Pt instructed not to smoke as of now until after surgery. Pt is diabetic, type 2. Last A1C was 8.3 on 04/13/16. Pt states his fasting blood sugar is usually around 180. Instructed pt to check his blood sugar in the AM when he gets up and every 2 hours until he leaves for the hospital. If blood sugar is 70 or below, treat with 1/2 cup of clear juice (apple or cranberry) and recheck blood sugar 15 minutes after drinking juice. If blood sugar continues to be 70 or below, call the Short Stay department and ask to speak to a nurse. Pt voiced understanding.

## 2016-04-20 ENCOUNTER — Ambulatory Visit (HOSPITAL_COMMUNITY): Payer: Self-pay | Admitting: Certified Registered"

## 2016-04-20 ENCOUNTER — Encounter (HOSPITAL_COMMUNITY)
Admission: RE | Disposition: A | Discharge status: Home / Self Care | Payer: Self-pay | Source: Ambulatory Visit | Attending: Orthopedic Surgery

## 2016-04-20 ENCOUNTER — Ambulatory Visit (HOSPITAL_COMMUNITY)
Admission: RE | Admit: 2016-04-20 | Discharge: 2016-04-20 | Disposition: A | Discharge status: Home / Self Care | Payer: Self-pay | Source: Ambulatory Visit | Attending: Orthopedic Surgery | Admitting: Orthopedic Surgery

## 2016-04-20 ENCOUNTER — Encounter (HOSPITAL_COMMUNITY): Payer: Self-pay | Admitting: *Deleted

## 2016-04-20 DIAGNOSIS — S91302S Unspecified open wound, left foot, sequela: Secondary | ICD-10-CM

## 2016-04-20 DIAGNOSIS — R0600 Dyspnea, unspecified: Secondary | ICD-10-CM | POA: Insufficient documentation

## 2016-04-20 DIAGNOSIS — D649 Anemia, unspecified: Secondary | ICD-10-CM | POA: Insufficient documentation

## 2016-04-20 DIAGNOSIS — I4510 Unspecified right bundle-branch block: Secondary | ICD-10-CM | POA: Insufficient documentation

## 2016-04-20 DIAGNOSIS — F17210 Nicotine dependence, cigarettes, uncomplicated: Secondary | ICD-10-CM | POA: Insufficient documentation

## 2016-04-20 DIAGNOSIS — Z833 Family history of diabetes mellitus: Secondary | ICD-10-CM | POA: Insufficient documentation

## 2016-04-20 DIAGNOSIS — M869 Osteomyelitis, unspecified: Secondary | ICD-10-CM | POA: Insufficient documentation

## 2016-04-20 DIAGNOSIS — E1140 Type 2 diabetes mellitus with diabetic neuropathy, unspecified: Secondary | ICD-10-CM | POA: Insufficient documentation

## 2016-04-20 DIAGNOSIS — I4891 Unspecified atrial fibrillation: Secondary | ICD-10-CM | POA: Insufficient documentation

## 2016-04-20 DIAGNOSIS — Z8619 Personal history of other infectious and parasitic diseases: Secondary | ICD-10-CM | POA: Insufficient documentation

## 2016-04-20 DIAGNOSIS — F329 Major depressive disorder, single episode, unspecified: Secondary | ICD-10-CM | POA: Insufficient documentation

## 2016-04-20 DIAGNOSIS — E039 Hypothyroidism, unspecified: Secondary | ICD-10-CM | POA: Insufficient documentation

## 2016-04-20 HISTORY — DX: Pneumonia, unspecified organism: J18.9

## 2016-04-20 HISTORY — DX: Unspecified atrial fibrillation: I48.91

## 2016-04-20 HISTORY — DX: Depression, unspecified: F32.A

## 2016-04-20 HISTORY — DX: Hypothyroidism, unspecified: E03.9

## 2016-04-20 HISTORY — DX: Anemia, unspecified: D64.9

## 2016-04-20 HISTORY — DX: Dyspnea, unspecified: R06.00

## 2016-04-20 HISTORY — PX: AMPUTATION: SHX166

## 2016-04-20 HISTORY — DX: Major depressive disorder, single episode, unspecified: F32.9

## 2016-04-20 HISTORY — DX: Personal history of other infectious and parasitic diseases: Z86.19

## 2016-04-20 LAB — CBC
HEMATOCRIT: 40.9 % (ref 39.0–52.0)
Hemoglobin: 14.1 g/dL (ref 13.0–17.0)
MCH: 30.3 pg (ref 26.0–34.0)
MCHC: 34.5 g/dL (ref 30.0–36.0)
MCV: 88 fL (ref 78.0–100.0)
Platelets: 226 10*3/uL (ref 150–400)
RBC: 4.65 MIL/uL (ref 4.22–5.81)
RDW: 12 % (ref 11.5–15.5)
WBC: 7.6 10*3/uL (ref 4.0–10.5)

## 2016-04-20 LAB — BASIC METABOLIC PANEL
Anion gap: 10 (ref 5–15)
BUN: 18 mg/dL (ref 6–20)
CALCIUM: 10.1 mg/dL (ref 8.9–10.3)
CO2: 23 mmol/L (ref 22–32)
CREATININE: 1.14 mg/dL (ref 0.61–1.24)
Chloride: 105 mmol/L (ref 101–111)
GFR calc Af Amer: >60 mL/min (ref >60)
GFR calc non Af Amer: >60 mL/min (ref >60)
GLUCOSE: 172 mg/dL — AB (ref 65–99)
POTASSIUM: 5 mmol/L (ref 3.5–5.1)
Sodium: 138 mmol/L (ref 135–145)

## 2016-04-20 LAB — GLUCOSE, CAPILLARY
GLUCOSE-CAPILLARY: 141 mg/dL — AB (ref 65–99)
GLUCOSE-CAPILLARY: 165 mg/dL — AB (ref 65–99)
Glucose-Capillary: 174 mg/dL — ABNORMAL HIGH (ref 65–99)

## 2016-04-20 SURGERY — AMPUTATION, FOOT, RAY
Anesthesia: Monitor Anesthesia Care | Site: Foot | Laterality: Left

## 2016-04-20 MED ORDER — CHLORHEXIDINE GLUCONATE 4 % EX LIQD: 60.0000 mL | Freq: Once | CUTANEOUS | Status: DC

## 2016-04-20 MED ORDER — MIDAZOLAM HCL 2 MG/2ML IJ SOLN
INTRAMUSCULAR | Status: AC
  Filled 2016-04-20: qty 2

## 2016-04-20 MED ORDER — GLYCOPYRROLATE 0.2 MG/ML IJ SOLN
INTRAMUSCULAR | Status: DC | PRN
  Administered 2016-04-20: 0.1 mg via INTRAVENOUS

## 2016-04-20 MED ORDER — PROPOFOL 10 MG/ML IV BOLUS
INTRAVENOUS | Status: DC | PRN
  Administered 2016-04-20 (×2): 30 mg via INTRAVENOUS

## 2016-04-20 MED ORDER — LACTATED RINGERS IV SOLN
INTRAVENOUS | Status: DC
  Administered 2016-04-20: 11:00:00 via INTRAVENOUS

## 2016-04-20 MED ORDER — FENTANYL CITRATE (PF) 100 MCG/2ML IJ SOLN
INTRAMUSCULAR | Status: AC
  Filled 2016-04-20: qty 2

## 2016-04-20 MED ORDER — OXYCODONE HCL 5 MG/5ML PO SOLN: 5.0000 mg | Freq: Once | ORAL | Status: AC | PRN

## 2016-04-20 MED ORDER — OXYCODONE HCL 5 MG PO TABS
5.0000 mg | ORAL_TABLET | Freq: Once | ORAL | Status: AC | PRN
  Administered 2016-04-20: 5 mg via ORAL

## 2016-04-20 MED ORDER — HYDROCODONE-ACETAMINOPHEN 5-325 MG PO TABS: 1.0000 | ORAL_TABLET | Freq: Four times a day (QID) | ORAL | 0 refills | Status: DC | PRN

## 2016-04-20 MED ORDER — BUPIVACAINE-EPINEPHRINE (PF) 0.5% -1:200000 IJ SOLN
INTRAMUSCULAR | Status: DC | PRN
  Administered 2016-04-20: 30 mL via PERINEURAL

## 2016-04-20 MED ORDER — LIDOCAINE HCL (CARDIAC) 20 MG/ML IV SOLN
INTRAVENOUS | Status: DC | PRN
  Administered 2016-04-20: 60 mg via INTRATRACHEAL

## 2016-04-20 MED ORDER — ONDANSETRON HCL 4 MG/2ML IJ SOLN
INTRAMUSCULAR | Status: DC | PRN
  Administered 2016-04-20: 4 mg via INTRAVENOUS

## 2016-04-20 MED ORDER — LIDOCAINE 2% (20 MG/ML) 5 ML SYRINGE
INTRAMUSCULAR | Status: AC
  Filled 2016-04-20: qty 5

## 2016-04-20 MED ORDER — 0.9 % SODIUM CHLORIDE (POUR BTL) OPTIME
TOPICAL | Status: DC | PRN
  Administered 2016-04-20: 1000 mL

## 2016-04-20 MED ORDER — CEFAZOLIN SODIUM-DEXTROSE 2-4 GM/100ML-% IV SOLN
2.0000 g | INTRAVENOUS | Status: AC
  Administered 2016-04-20: 2 g via INTRAVENOUS
  Filled 2016-04-20: qty 100

## 2016-04-20 MED ORDER — FENTANYL CITRATE (PF) 100 MCG/2ML IJ SOLN
INTRAMUSCULAR | Status: AC
  Administered 2016-04-20: 100 ug
  Filled 2016-04-20: qty 2

## 2016-04-20 MED ORDER — FENTANYL CITRATE (PF) 100 MCG/2ML IJ SOLN
INTRAMUSCULAR | Status: DC | PRN
  Administered 2016-04-20 (×2): 50 ug via INTRAVENOUS

## 2016-04-20 MED ORDER — ONDANSETRON HCL 4 MG/2ML IJ SOLN
INTRAMUSCULAR | Status: AC
  Filled 2016-04-20: qty 2

## 2016-04-20 MED ORDER — FENTANYL CITRATE (PF) 100 MCG/2ML IJ SOLN: 25.0000 ug | INTRAMUSCULAR | Status: DC | PRN

## 2016-04-20 MED ORDER — OXYCODONE HCL 5 MG PO TABS
ORAL_TABLET | ORAL | Status: AC
  Filled 2016-04-20: qty 1

## 2016-04-20 MED ORDER — PROPOFOL 10 MG/ML IV BOLUS
INTRAVENOUS | Status: AC
  Filled 2016-04-20: qty 20

## 2016-04-20 SURGICAL SUPPLY — 23 items
BLADE SURG 21 STRL SS (BLADE) ×3 IMPLANT
BNDG COHESIVE 4X5 TAN STRL (GAUZE/BANDAGES/DRESSINGS) ×3 IMPLANT
BNDG GAUZE ELAST 4 BULKY (GAUZE/BANDAGES/DRESSINGS) ×3 IMPLANT
COVER SURGICAL LIGHT HANDLE (MISCELLANEOUS) ×4 IMPLANT
DRAPE U-SHAPE 47X51 STRL (DRAPES) ×4 IMPLANT
DRSG ADAPTIC 3X8 NADH LF (GAUZE/BANDAGES/DRESSINGS) ×3 IMPLANT
DRSG PAD ABDOMINAL 8X10 ST (GAUZE/BANDAGES/DRESSINGS) ×4 IMPLANT
DURAPREP 26ML APPLICATOR (WOUND CARE) ×3 IMPLANT
ELECT REM PT RETURN 9FT ADLT (ELECTROSURGICAL) ×3
ELECTRODE REM PT RTRN 9FT ADLT (ELECTROSURGICAL) ×1 IMPLANT
GAUZE SPONGE 4X4 12PLY STRL (GAUZE/BANDAGES/DRESSINGS) ×3 IMPLANT
GLOVE BIOGEL PI IND STRL 9 (GLOVE) ×1 IMPLANT
GLOVE BIOGEL PI INDICATOR 9 (GLOVE) ×2
GLOVE SURG ORTHO 9.0 STRL STRW (GLOVE) ×3 IMPLANT
GOWN STRL REUS W/ TWL XL LVL3 (GOWN DISPOSABLE) ×2 IMPLANT
GOWN STRL REUS W/TWL XL LVL3 (GOWN DISPOSABLE) ×6
KIT BASIN OR (CUSTOM PROCEDURE TRAY) ×3 IMPLANT
KIT ROOM TURNOVER OR (KITS) ×3 IMPLANT
NS IRRIG 1000ML POUR BTL (IV SOLUTION) ×3 IMPLANT
PACK ORTHO EXTREMITY (CUSTOM PROCEDURE TRAY) ×3 IMPLANT
PAD ARMBOARD 7.5X6 YLW CONV (MISCELLANEOUS) ×4 IMPLANT
SUT ETHILON 2 0 PSLX (SUTURE) ×3 IMPLANT
TOWEL OR 17X26 10 PK STRL BLUE (TOWEL DISPOSABLE) ×3 IMPLANT

## 2016-04-20 NOTE — Anesthesia Procedure Notes (Signed)
Anesthesia Regional Block: Popliteal block   Pre-Anesthetic Checklist: ,, timeout performed, Correct Patient, Correct Site, Correct Laterality, Correct Procedure, Correct Position, site marked, Risks and benefits discussed,  Surgical consent,  Pre-op evaluation,  At surgeon's request and post-op pain management  Laterality: Lower and Left  Prep: chloraprep       Needles:  Injection technique: Single-shot  Needle Type: Echogenic Stimulator Needle          Additional Needles:   Procedures: ultrasound guided, nerve stimulator,,,,,,  Narrative:  Start time: 04/20/2016 12:22 PM End time: 04/20/2016 12:28 PM Injection made incrementally with aspirations every 5 mL.  Performed by: Personally  Anesthesiologist: Geneal Huebert  Additional Notes: H+P and labs reviewed, risks and benefits discussed with patient, procedure tolerated well without complications

## 2016-04-20 NOTE — Op Note (Signed)
04/20/2016  12:58 PM  PATIENT:  Darren Skinner    PRE-OPERATIVE DIAGNOSIS:  Osteomyelitis Left Great Toe  POST-OPERATIVE DIAGNOSIS:  Same  PROCEDURE:  Left Great Toe Amputation vs. 1st Ray Amputation  SURGEON:  Nadara MustardMarcus V Londan Coplen, MD  PHYSICIAN ASSISTANT:None ANESTHESIA:   General  PREOPERATIVE INDICATIONS:  Darren Skinner is a  56 y.o. male with a diagnosis of Osteomyelitis Left Great Toe who failed conservative measures and elected for surgical management.    The risks benefits and alternatives were discussed with the patient preoperatively including but not limited to the risks of infection, bleeding, nerve injury, cardiopulmonary complications, the need for revision surgery, among others, and the patient was willing to proceed.  OPERATIVE IMPLANTS: None  OPERATIVE FINDINGS: Good petechial bleeding at the amputation site no evidence of abscess.  OPERATIVE PROCEDURE: Patient brought the operating room after undergoing a popliteal block. After adequate levels anesthesia obtained patient's left lower extremity was prepped using DuraPrep draped into a sterile field a timeout was called. A racquet incision was made just distal to the MTP joint. This was carried down to the MTP joint. The toe was resected at this level. Electrocautery was used for hemostasis wound was irrigated with normal saline. There is no signs of infection extending down to the MTP joint. The incision was closed using 2-0 nylon a sterile dressing was applied patient was taken the PACU in stable condition plan for discharge to home.

## 2016-04-20 NOTE — Progress Notes (Signed)
Orthopedic Tech Progress Note Patient Details:  Darren Skinner June 14, 1960 [id number]  Ortho Devices Type of Ortho Device: Crutches Ortho Device/Splint Location: Applied post op shoe to pt Left foot.  Pt tolerated well.  Left Foot. Ortho Device/Splint Interventions: Application   Saul FordyceJennifer C Arshia Spellman 04/20/2016, 2:48 PM

## 2016-04-20 NOTE — Progress Notes (Signed)
Orthopedic Tech Progress Note Patient Details:  Kassie MendsMark K Sadlowski 1960-11-30 [id number]  Ortho Devices Type of Ortho Device: Postop shoe/boot Ortho Device/Splint Location: Applied post op shoe to pt Left foot.  Pt tolerated well.  Left Foot. Ortho Device/Splint Interventions: Application   Alvina ChouWilliams, Dariella Gillihan C 04/20/2016, 1:43 PM

## 2016-04-20 NOTE — Transfer of Care (Signed)
Immediate Anesthesia Transfer of Care Note  Patient: Darren Skinner  Procedure(s) Performed: Procedure(s): Left Great Toe Amputation (Left)  Patient Location: PACU  Anesthesia Type:MAC  Level of Consciousness: awake, alert  and patient cooperative  Airway & Oxygen Therapy: Patient Spontanous Breathing and Patient connected to nasal cannula oxygen  Post-op Assessment: Report given to RN, Post -op Vital signs reviewed and stable, Patient moving all extremities X 4 and Patient able to stick tongue midline  Post vital signs: Reviewed and stable  Last Vitals:  Vitals:   04/20/16 1025 04/20/16 1226  BP: 133/76 130/72  Pulse: 62   Resp: 18   Temp: 36.5 C     Last Pain:  Vitals:   04/20/16 1025  TempSrc: Oral      Patients Stated Pain Goal: 5 (63/84/66 5993)  Complications: No apparent anesthesia complications

## 2016-04-20 NOTE — Anesthesia Preprocedure Evaluation (Signed)
Anesthesia Evaluation  Patient identified by MRN, date of birth, ID band Patient awake    Reviewed: Allergy & Precautions, NPO status , Patient's Chart, lab work & pertinent test results  History of Anesthesia Complications Negative for: history of anesthetic complications  Airway Mallampati: II  TM Distance: >3 FB Neck ROM: Full    Dental  (+) Teeth Intact   Pulmonary neg shortness of breath, neg COPD, neg recent URI, Current Smoker,    breath sounds clear to auscultation       Cardiovascular negative cardio ROS   Rhythm:Regular     Neuro/Psych PSYCHIATRIC DISORDERS Depression    GI/Hepatic negative GI ROS, Neg liver ROS,   Endo/Other  diabetes, Type 2, Oral Hypoglycemic AgentsHypothyroidism   Renal/GU Renal InsufficiencyRenal disease     Musculoskeletal   Abdominal   Peds  Hematology negative hematology ROS (+)   Anesthesia Other Findings   Reproductive/Obstetrics                             Anesthesia Physical Anesthesia Plan  ASA: II  Anesthesia Plan: MAC and Regional   Post-op Pain Management:    Induction: Intravenous  Airway Management Planned: Natural Airway, Nasal Cannula and Simple Face Mask  Additional Equipment: None  Intra-op Plan:   Post-operative Plan:   Informed Consent: I have reviewed the patients History and Physical, chart, labs and discussed the procedure including the risks, benefits and alternatives for the proposed anesthesia with the patient or authorized representative who has indicated his/her understanding and acceptance.   Dental advisory given  Plan Discussed with: CRNA and Surgeon  Anesthesia Plan Comments:         Anesthesia Quick Evaluation

## 2016-04-21 NOTE — Anesthesia Postprocedure Evaluation (Addendum)
Anesthesia Post Note  Patient: Darren Skinner  Procedure(s) Performed: Procedure(s) (LRB): Left Great Toe Amputation (Left)  Patient location during evaluation: PACU Anesthesia Type: Regional Level of consciousness: awake and alert Pain management: pain level controlled Vital Signs Assessment: post-procedure vital signs reviewed and stable Respiratory status: spontaneous breathing, nonlabored ventilation, respiratory function stable and patient connected to nasal cannula oxygen Cardiovascular status: stable and blood pressure returned to baseline Anesthetic complications: no       Last Vitals:  Vitals:   04/20/16 1345 04/20/16 1353  BP:    Pulse: 75 82  Resp: 14 (!) 21  Temp:      Last Pain:  Vitals:   04/20/16 1345  TempSrc:   PainSc: 0-No pain                 Polly Barner

## 2016-04-22 ENCOUNTER — Encounter (HOSPITAL_COMMUNITY): Payer: Self-pay | Admitting: Orthopedic Surgery

## 2016-04-22 NOTE — H&P (Signed)
Darren Skinner is an 56 y.o. male.   Chief Complaint: Ostomy myelitis left great toe. HPI: Patient is a 58 shell gentleman diabetic insensate neuropathy with chronic ostomy myelitis of the left great toe which is failed conservative wound care.  Past Medical History:  Diagnosis Date  . Anemia   . Atrial fibrillation (HCC)    not on any medication at this time  . Depression   . Diabetes mellitus without complication (HCC)   . Dyspnea    due to smoking  . History of shingles 2017   in right eye   . Hypothyroidism   . Neuropathy (HCC)    both feet and 1 hand  . Pneumonia   . Thyroid disease     Past Surgical History:  Procedure Laterality Date  . AMPUTATION Left 11/11/2014   Procedure: Incision and drainage of left great toe;  Surgeon: Cammy Copa, MD;  Location: [facility] OR;  Service: Orthopedics;  Laterality: Left;  . AMPUTATION Left 04/20/2016   Procedure: Left Great Toe Amputation;  Surgeon: Nadara Mustard, MD;  Location: [facility] OR;  Service: Orthopedics;  Laterality: Left;  . FRACTURE SURGERY     right ankle    Family History  Problem Relation Age of Onset  . Diabetes Mother   . Diabetes Father    Social History:  reports that he has been smoking Cigarettes.  He has been smoking about 1.00 pack per day. He has never used smokeless tobacco. He reports that he does not drink alcohol or use drugs.  Allergies:  Allergies  Allergen Reactions  . No Known Allergies     No prescriptions prior to admission.    Results for orders placed or performed during the hospital encounter of 04/20/16 (from the past 48 hour(s))  Basic metabolic panel     Status: Abnormal   Collection Time: 04/20/16 10:28 AM  Result Value Ref Range   Sodium 138 135 - 145 mmol/L   Potassium 5.0 3.5 - 5.1 mmol/L   Chloride 105 101 - 111 mmol/L   CO2 23 22 - 32 mmol/L   Glucose, Bld 172 (H) 65 - 99 mg/dL   BUN 18 6 - 20 mg/dL   Creatinine, Ser 3.59 0.61 - 1.24 mg/dL   Calcium 17.8 8.9 - 67.7 mg/dL   GFR  calc non Af Amer >60 >60 mL/min   GFR calc Af Amer >60 >60 mL/min    Comment: (NOTE) The eGFR has been calculated using the CKD EPI equation. This calculation has not been validated in all clinical situations. eGFR's persistently <60 mL/min signify possible Chronic Kidney Disease.    Anion gap 10 5 - 15  CBC     Status: None   Collection Time: 04/20/16 10:28 AM  Result Value Ref Range   WBC 7.6 4.0 - 10.5 K/uL   RBC 4.65 4.22 - 5.81 MIL/uL   Hemoglobin 14.1 13.0 - 17.0 g/dL   HCT 21.0 96.7 - 93.6 %   MCV 88.0 78.0 - 100.0 fL   MCH 30.3 26.0 - 34.0 pg   MCHC 34.5 30.0 - 36.0 g/dL   RDW 10.9 22.0 - 77.1 %   Platelets 226 150 - 400 K/uL  Glucose, capillary     Status: Abnormal   Collection Time: 04/20/16 10:32 AM  Result Value Ref Range   Glucose-Capillary 174 (H) 65 - 99 mg/dL  Glucose, capillary     Status: Abnormal   Collection Time: 04/20/16 12:34 PM  Result Value Ref  Range   Glucose-Capillary 141 (H) 65 - 99 mg/dL  Glucose, capillary     Status: Abnormal   Collection Time: 04/20/16  1:15 PM  Result Value Ref Range   Glucose-Capillary 165 (H) 65 - 99 mg/dL   Comment 1 Notify RN    Comment 2 Document in Chart    No results found.  Review of Systems  All other systems reviewed and are negative.   Blood pressure 126/74, pulse 82, temperature 97 F (36.1 C), resp. rate (!) 21, height '6\' 2"'$  (1.88 m), weight 217 lb (98.4 kg), SpO2 92 %. Physical Exam  On examination patient is alert oriented no adenopathy well-dressed normal affect normal respiratory effort. He has a palpable dorsalis pedis pulse. Examination is ostium myelitis of the left great toe. Assessment/Plan Assessment: Diabetic insensate neuropathy ostium myelitis left great toe. Plan: We'll plan for amputation of the left great toe through the MTP joint. Risk and benefits were discussed including risk of additional surgery. Patient states she understands wish to proceed at this time.  Newt Minion,  MD 04/22/2016, 9:44 AM

## 2016-04-27 ENCOUNTER — Encounter (INDEPENDENT_AMBULATORY_CARE_PROVIDER_SITE_OTHER): Payer: Self-pay | Admitting: Orthopedic Surgery

## 2016-04-27 ENCOUNTER — Ambulatory Visit (INDEPENDENT_AMBULATORY_CARE_PROVIDER_SITE_OTHER): Payer: Self-pay | Admitting: Orthopedic Surgery

## 2016-04-27 DIAGNOSIS — Z89412 Acquired absence of left great toe: Secondary | ICD-10-CM

## 2016-04-27 DIAGNOSIS — IMO0002 Reserved for concepts with insufficient information to code with codable children: Secondary | ICD-10-CM | POA: Insufficient documentation

## 2016-04-27 NOTE — Progress Notes (Signed)
Office Visit Note   Patient: Darren Skinner           Date of Birth: 31-Jul-1960           MRN: [id number] Visit Date: 04/27/2016              Requested by: Daisy Floroharles Alan Ross, MD [address] [place], [place] [address] PCP: Duane LopeAlan Ross, MD   Assessment & Plan: Visit Diagnoses:  1. Great toe amputation status, left (HCC)     Plan: Follow up in office in 1 week for suture removal. Cleanse incision daily. Apply dry dressing. Continue to WTB through the heel.   Follow-Up Instructions: Return in about 1 week (around 05/04/2016) for suture removal.   Orders:  No orders of the defined types were placed in this encounter.  No orders of the defined types were placed in this encounter.     Procedures: No procedures performed   Clinical Data: No additional findings.   Subjective: Chief Complaint  Patient presents with  . Left Great Toe - Routine Post Op    Patient returns status post left great toe amputation on 04/20/2016. He is one week post op. He states that he is doing well. He completed the Cipro yesterday. Sutures are intact. He is taking Hydrocodone for pain.     Review of Systems  Constitutional: Negative for chills and fever.  Skin: Negative for color change and wound.     Objective: Vital Signs: There were no vitals taken for this visit.  Physical Exam Left foot: great toe is surgically absent. Incision is well approximated with sutures. No open areas. Scant dried blood on incision. No surrounding erythema. No drainage. No odor.  Ortho Exam  Specialty Comments:  No specialty comments available.  Imaging: No results found.   PMFS History: Patient Active Problem List   Diagnosis Date Noted  . Great toe amputation status, left (HCC) 04/27/2016  . Diabetic neuropathy with neurologic complication (HCC)   . Sepsis (HCC) 11/10/2014  . Other pancytopenia (HCC) 11/10/2014  . Type II or unspecified type diabetes mellitus with other specified manifestations,  not stated as uncontrolled   . Type II or unspecified type diabetes mellitus without mention of complication, uncontrolled   . Hypothyroidism 11/09/2014  . History of shingles 11/09/2014  . Mood disorder (HCC) 11/09/2014  . Type 2 diabetes mellitus, uncontrolled (HCC) 11/09/2014  . Diabetic foot infection (HCC) 11/08/2014  . Mass of left side of neck 10/01/2014  . Tobacco use disorder 10/01/2014  . Proteinuria 10/01/2014   Past Medical History:  Diagnosis Date  . Anemia   . Atrial fibrillation (HCC)    not on any medication at this time  . Depression   . Diabetes mellitus without complication (HCC)   . Dyspnea    due to smoking  . History of shingles 2017   in right eye   . Hypothyroidism   . Neuropathy (HCC)    both feet and 1 hand  . Pneumonia   . Thyroid disease     Family History  Problem Relation Age of Onset  . Diabetes Mother   . Diabetes Father     Past Surgical History:  Procedure Laterality Date  . AMPUTATION Left 11/11/2014   Procedure: Incision and drainage of left great toe;  Surgeon: Cammy CopaScott Gregory Dean, MD;  [facility];  Service: Orthopedics;  Laterality: Left;  . AMPUTATION Left 04/20/2016   Procedure: Left Great Toe Amputation;  Surgeon: Nadara MustardMarcus Duda V,  MD;  Location: [facility] OR;  Service: Orthopedics;  Laterality: Left;  . FRACTURE SURGERY     right ankle   Social History   Occupational History  . Not on file.   Social History Main Topics  . Smoking status: Current Every Day Smoker    Packs/day: 1.00    Types: Cigarettes  . Smokeless tobacco: Never Used     Comment: down to 5-6 cigarettes a day  . Alcohol use No  . Drug use: No  . Sexual activity: Not on file

## 2016-05-04 ENCOUNTER — Ambulatory Visit (INDEPENDENT_AMBULATORY_CARE_PROVIDER_SITE_OTHER): Payer: Self-pay | Admitting: Family

## 2016-05-04 DIAGNOSIS — Z89412 Acquired absence of left great toe: Secondary | ICD-10-CM

## 2016-05-04 DIAGNOSIS — IMO0002 Reserved for concepts with insufficient information to code with codable children: Secondary | ICD-10-CM

## 2016-05-09 NOTE — Progress Notes (Signed)
Office Visit Note   Patient: Darren Skinner           Date of Birth: 1960-09-17           MRN: [id number] Visit Date: 05/04/2016              Requested by: Daisy Floroharles Alan Ross, MD [address] [place], [place] [address] PCP: Duane LopeAlan Ross, MD  No chief complaint on file.   HPI: The patient is a 56 year old gentleman who presents today status post great toe amputation on the left. The surgery was on 04/20/2016. Is doing well in a postop shoe.    Assessment & Plan: Visit Diagnoses:  1. Great toe amputation status, left (HCC)     Plan: Continue daily Dial soap cleansing. Dry dressing such as a Band-Aid. Advance weightbearing as tolerated. We'll follow-up one last time for wound check.  Follow-Up Instructions: Return in about 2 weeks (around 05/18/2016).   Ortho Exam Incision is healing well. There are no open areas noted drainage a little dried blood. No surrounding erythema no swelling no sign of infection Imaging: No results found.  Labs: Lab Results  Component Value Date   HGBA1C 8.1 (H) 11/09/2014   ESRSEDRATE 56 (H) 11/09/2014   CRP 17.1 (H) 11/09/2014   REPTSTATUS 11/17/2014 FINAL 11/12/2014   REPTSTATUS 11/16/2014 FINAL 11/12/2014   GRAMSTAIN  11/12/2014    RARE WBC PRESENT,BOTH PMN AND MONONUCLEAR NO SQUAMOUS EPITHELIAL CELLS SEEN NO ORGANISMS SEEN Performed at [facility]    GRAMSTAIN  11/12/2014    RARE WBC PRESENT, PREDOMINANTLY PMN NO SQUAMOUS EPITHELIAL CELLS SEEN NO ORGANISMS SEEN Performed at [facility]    CULT  11/12/2014    NO ANAEROBES ISOLATED Performed at [facility]    CULT  11/12/2014    RARE STAPHYLOCOCCUS AUREUS Note: RIFAMPIN AND GENTAMICIN SHOULD NOT BE USED AS SINGLE DRUGS FOR TREATMENT OF STAPH INFECTIONS. This organism is presumed to be Clindamycin resistant based on detection of inducible Clindamycin resistance. Performed at [facility]    LABORGA STAPHYLOCOCCUS AUREUS 11/12/2014    Orders:    No orders of the defined types were placed in this encounter.  No orders of the defined types were placed in this encounter.    Procedures: No procedures performed  Clinical Data: No additional findings.  Subjective: Review of Systems  Constitutional: Negative for chills and fever.    Objective: Vital Signs: There were no vitals taken for this visit.  Specialty Comments:  No specialty comments available.  PMFS History: Patient Active Problem List   Diagnosis Date Noted  . Great toe amputation status, left (HCC) 04/27/2016  . Diabetic neuropathy with neurologic complication (HCC)   . Sepsis (HCC) 11/10/2014  . Other pancytopenia (HCC) 11/10/2014  . Type II or unspecified type diabetes mellitus with other specified manifestations, not stated as uncontrolled   . Type II or unspecified type diabetes mellitus without mention of complication, uncontrolled   . Hypothyroidism 11/09/2014  . History of shingles 11/09/2014  . Mood disorder (HCC) 11/09/2014  . Type 2 diabetes mellitus, uncontrolled (HCC) 11/09/2014  . Diabetic foot infection (HCC) 11/08/2014  . Mass of left side of neck 10/01/2014  . Tobacco use disorder 10/01/2014  . Proteinuria 10/01/2014   Past Medical History:  Diagnosis Date  . Anemia   . Atrial fibrillation (HCC)    not on any medication at this time  . Depression   . Diabetes mellitus without complication (HCC)   .  Dyspnea    due to smoking  . History of shingles 2017   in right eye   . Hypothyroidism   . Neuropathy (HCC)    both feet and 1 hand  . Pneumonia   . Thyroid disease     Family History  Problem Relation Age of Onset  . Diabetes Mother   . Diabetes Father     Past Surgical History:  Procedure Laterality Date  . AMPUTATION Left 11/11/2014   Procedure: Incision and drainage of left great toe;  Surgeon: Cammy Copa, MD;  [facility];  Service: Orthopedics;  Laterality: Left;  . AMPUTATION Left 04/20/2016   Procedure: Left  Great Toe Amputation;  Surgeon: Nadara Mustard, MD;  Location: [facility] OR;  Service: Orthopedics;  Laterality: Left;  . FRACTURE SURGERY     right ankle   Social History   Occupational History  . Not on file.   Social History Main Topics  . Smoking status: Current Every Day Smoker    Packs/day: 1.00    Types: Cigarettes  . Smokeless tobacco: Never Used     Comment: down to 5-6 cigarettes a day  . Alcohol use No  . Drug use: No  . Sexual activity: Not on file

## 2016-05-17 ENCOUNTER — Ambulatory Visit (INDEPENDENT_AMBULATORY_CARE_PROVIDER_SITE_OTHER): Payer: Self-pay | Admitting: Orthopedic Surgery

## 2016-05-17 ENCOUNTER — Encounter (INDEPENDENT_AMBULATORY_CARE_PROVIDER_SITE_OTHER): Payer: Self-pay | Admitting: Orthopedic Surgery

## 2016-05-17 VITALS — Ht 74.0 in | Wt 217.0 lb

## 2016-05-17 DIAGNOSIS — Z89412 Acquired absence of left great toe: Secondary | ICD-10-CM

## 2016-05-17 DIAGNOSIS — IMO0002 Reserved for concepts with insufficient information to code with codable children: Secondary | ICD-10-CM

## 2016-05-17 NOTE — Progress Notes (Signed)
Office Visit Note   Patient: Darren Skinner           Date of Birth: [birth year]           MRN: [id number] Visit Date: 05/17/2016              Requested by: Daisy Floro, MD [address] [place], [place] [address] PCP: Duane Lope, MD  Chief Complaint  Patient presents with  . Left Foot - Routine Post Op    04/20/16 left great toe amputation      HPI: The patient is a 56 year old gentleman who presents today for evaluation of left great toe amputation. He is full weightbearing in regular shoewear with a Band-Aid over his incision.  Assessment & Plan: Visit Diagnoses:  1. Great toe amputation status, left (HCC)     Plan: He will discontinue dressings. May use compression stockings for swelling. Follow-up in office as needed.  Follow-Up Instructions: Return if symptoms worsen or fail to improve.   Ortho Exam  Patient is alert, oriented, no adenopathy, well-dressed, normal affect, normal respiratory effort. Incision from the great toe amputation is well-healed. There is no open area and no drainage no erythema. Does have swelling to the left lower extremity this is moderate. Some dry flaking skin no ulcerations.  Imaging: No results found.  Labs: Lab Results  Component Value Date   HGBA1C 8.1 (H) 11/09/2014   ESRSEDRATE 56 (H) 11/09/2014   CRP 17.1 (H) 11/09/2014   REPTSTATUS 11/17/2014 FINAL 11/12/2014   REPTSTATUS 11/16/2014 FINAL 11/12/2014   GRAMSTAIN  11/12/2014    RARE WBC PRESENT,BOTH PMN AND MONONUCLEAR NO SQUAMOUS EPITHELIAL CELLS SEEN NO ORGANISMS SEEN Performed at [facility]    GRAMSTAIN  11/12/2014    RARE WBC PRESENT, PREDOMINANTLY PMN NO SQUAMOUS EPITHELIAL CELLS SEEN NO ORGANISMS SEEN Performed at [facility]    CULT  11/12/2014    NO ANAEROBES ISOLATED Performed at [facility]    CULT  11/12/2014    RARE STAPHYLOCOCCUS AUREUS Note: RIFAMPIN AND GENTAMICIN SHOULD NOT BE USED AS SINGLE DRUGS FOR TREATMENT OF  STAPH INFECTIONS. This organism is presumed to be Clindamycin resistant based on detection of inducible Clindamycin resistance. Performed at [facility]    LABORGA STAPHYLOCOCCUS AUREUS 11/12/2014    Orders:  No orders of the defined types were placed in this encounter.  No orders of the defined types were placed in this encounter.    Procedures: No procedures performed  Clinical Data: No additional findings.  ROS:  All other systems negative, except as noted in the HPI. Review of Systems  Constitutional: Negative for chills and fever.  Cardiovascular: Positive for leg swelling.  Skin: Negative for color change and wound.    Objective: Vital Signs: Ht 6\' 2"  (1.88 m)   Wt 217 lb (98.4 kg)   BMI 27.86 kg/m   Specialty Comments:  No specialty comments available.  PMFS History: Patient Active Problem List   Diagnosis Date Noted  . Great toe amputation status, left (HCC) 04/27/2016  . Diabetic neuropathy with neurologic complication (HCC)   . Sepsis (HCC) 11/10/2014  . Other pancytopenia (HCC) 11/10/2014  . Type II or unspecified type diabetes mellitus with other specified manifestations, not stated as uncontrolled   . Type II or unspecified type diabetes mellitus without mention of complication, uncontrolled   . Hypothyroidism 11/09/2014  . History of shingles 11/09/2014  . Mood disorder (HCC) 11/09/2014  . Type 2 diabetes mellitus,  uncontrolled (HCC) 11/09/2014  . Diabetic foot infection (HCC) 11/08/2014  . Mass of left side of neck 10/01/2014  . Tobacco use disorder 10/01/2014  . Proteinuria 10/01/2014   Past Medical History:  Diagnosis Date  . Anemia   . Atrial fibrillation (HCC)    not on any medication at this time  . Depression   . Diabetes mellitus without complication (HCC)   . Dyspnea    due to smoking  . History of shingles 2017   in right eye   . Hypothyroidism   . Neuropathy (HCC)    both feet and 1 hand  . Pneumonia   . Thyroid  disease     Family History  Problem Relation Age of Onset  . Diabetes Mother   . Diabetes Father     Past Surgical History:  Procedure Laterality Date  . AMPUTATION Left 11/11/2014   Procedure: Incision and drainage of left great toe;  Surgeon: Cammy CopaScott Gregory Dean, MD;  [facility];  Service: Orthopedics;  Laterality: Left;  . AMPUTATION Left 04/20/2016   Procedure: Left Great Toe Amputation;  Surgeon: Nadara MustardMarcus Duda V, MD;  [facility];  Service: Orthopedics;  Laterality: Left;  . FRACTURE SURGERY     right ankle   Social History   Occupational History  . Not on file.   Social History Main Topics  . Smoking status: Current Every Day Smoker    Packs/day: 1.00    Types: Cigarettes  . Smokeless tobacco: Never Used     Comment: down to 5-6 cigarettes a day  . Alcohol use No  . Drug use: No  . Sexual activity: Not on file

## 2016-07-23 ENCOUNTER — Encounter (HOSPITAL_COMMUNITY): Payer: Self-pay | Admitting: Orthopedic Surgery

## 2016-07-23 NOTE — Addendum Note (Signed)
Addendum  created 07/23/16 1151 by Jailah Willis, MD   Sign clinical note    

## 2017-05-20 ENCOUNTER — Ambulatory Visit (INDEPENDENT_AMBULATORY_CARE_PROVIDER_SITE_OTHER): Payer: Self-pay | Admitting: Orthopedic Surgery

## 2017-05-20 ENCOUNTER — Encounter (INDEPENDENT_AMBULATORY_CARE_PROVIDER_SITE_OTHER): Payer: Self-pay | Admitting: Orthopedic Surgery

## 2017-05-20 ENCOUNTER — Ambulatory Visit (INDEPENDENT_AMBULATORY_CARE_PROVIDER_SITE_OTHER): Payer: Self-pay

## 2017-05-20 VITALS — Ht 74.0 in | Wt 217.0 lb

## 2017-05-20 DIAGNOSIS — M79671 Pain in right foot: Secondary | ICD-10-CM

## 2017-05-20 DIAGNOSIS — L97511 Non-pressure chronic ulcer of other part of right foot limited to breakdown of skin: Secondary | ICD-10-CM

## 2017-05-20 DIAGNOSIS — E1142 Type 2 diabetes mellitus with diabetic polyneuropathy: Secondary | ICD-10-CM

## 2017-05-20 NOTE — Progress Notes (Signed)
Office Visit Note   Patient: Darren Skinner           Date of Birth: 1960/05/16           MRN: [id number] Visit Date: 05/20/2017              Requested by: Daisy Floro, MD [address] [place], [place] [address] PCP: Daisy Floro, MD  Chief Complaint  Patient presents with  . Right Foot - Open Wound    2 new wounds: GT and medial Achilles x 3-4 days.      HPI: Patient is a 57 year old gentleman who was seen with an acute ulcer of the right great toe and over the Achilles he states is been there for about 3-4 days.  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  Patient's most recent hemoglobin A1c was 12.  Assessment & Plan: Visit Diagnoses:  1. Pain in right foot   2. Ulcer of toe of right foot, limited to breakdown of skin (HCC)   3. Diabetic polyneuropathy associated with type 2 diabetes mellitus (HCC)     Plan: Discussed the importance of diet and glucose control.  He will start antibiotic ointment dressing changes to the heel and toe recommended knee-high compression stockings.  Follow-Up Instructions: Return in about 1 month (around 06/17/2017).   Ortho Exam  Patient is alert, oriented, no adenopathy, well-dressed, normal affect, normal respiratory effort. Examination patient has a good pulse.  He has a superficial ulcer over the Achilles this is 10 mm in diameter 0.1 mm deep there is no cellulitis no exposed tendon.  There is no tenderness to palpation no fluctuance no Achilles defect.  Examination of the great toe he has a large ulcer over the great toe.  After informed consent a 10 blade knife was used to debride the skin and soft tissue back to healthy viable tissue the ulcer is 10 mm in diameter 1 mm deep and this does not probe to bone or tendon.  There is no ascending cellulitis.  Imaging: Xr Foot Complete Right  Result Date: 05/20/2017 3 view radiographs of the right foot shows no  destructive bony changes no signs of chronic osteomyelitis.  There is no air in the soft tissue no calcified vessels.  No images are attached to the encounter.  Labs: Lab Results  Component Value Date   HGBA1C 8.1 (H) 11/09/2014   ESRSEDRATE 56 (H) 11/09/2014   CRP 17.1 (H) 11/09/2014   REPTSTATUS 11/17/2014 FINAL 11/12/2014   REPTSTATUS 11/16/2014 FINAL 11/12/2014   GRAMSTAIN  11/12/2014    RARE WBC PRESENT,BOTH PMN AND MONONUCLEAR NO SQUAMOUS EPITHELIAL CELLS SEEN NO ORGANISMS SEEN Performed at [facility]    GRAMSTAIN  11/12/2014    RARE WBC PRESENT, PREDOMINANTLY PMN NO SQUAMOUS EPITHELIAL CELLS SEEN NO ORGANISMS SEEN Performed at [facility]    CULT  11/12/2014    NO ANAEROBES ISOLATED Performed at [facility]    CULT  11/12/2014    RARE STAPHYLOCOCCUS AUREUS Note: RIFAMPIN AND GENTAMICIN SHOULD NOT BE USED AS SINGLE DRUGS FOR TREATMENT OF STAPH INFECTIONS. This organism is presumed to be Clindamycin resistant based on detection of inducible Clindamycin resistance. Performed at [facility]    LABORGA STAPHYLOCOCCUS AUREUS 11/12/2014    @LABSALLVALUES (HGBA1)@  Body mass index is 27.86 kg/m.  Orders:  Orders Placed This Encounter  Procedures  .  XR Foot Complete Right   No orders of the defined types were placed in this encounter.    Procedures: No procedures performed  Clinical Data: No additional findings.  ROS:  All other systems negative, except as noted in the HPI. Review of Systems  Objective: Vital Signs: Ht 6\' 2"  (1.88 m)   Wt 217 lb (98.4 kg)   BMI 27.86 kg/m   Specialty Comments:  No specialty comments available.  PMFS History: Patient Active Problem List   Diagnosis Date Noted  . Great toe amputation status, left (HCC) 04/27/2016  . Diabetic neuropathy with neurologic complication (HCC)   . Sepsis (HCC) 11/10/2014  . Other pancytopenia (HCC) 11/10/2014  . Type II or unspecified type diabetes  mellitus with other specified manifestations, not stated as uncontrolled   . Type II or unspecified type diabetes mellitus without mention of complication, uncontrolled   . Hypothyroidism 11/09/2014  . History of shingles 11/09/2014  . Mood disorder (HCC) 11/09/2014  . Type 2 diabetes mellitus, uncontrolled (HCC) 11/09/2014  . Diabetic foot infection (HCC) 11/08/2014  . Mass of left side of neck 10/01/2014  . Tobacco use disorder 10/01/2014  . Proteinuria 10/01/2014   Past Medical History:  Diagnosis Date  . Anemia   . Atrial fibrillation (HCC)    not on any medication at this time  . Depression   . Diabetes mellitus without complication (HCC)   . Dyspnea    due to smoking  . History of shingles 2017   in right eye   . Hypothyroidism   . Neuropathy    both feet and 1 hand  . Pneumonia   . Thyroid disease     Family History  Problem Relation Age of Onset  . Diabetes Mother   . Diabetes Father     Past Surgical History:  Procedure Laterality Date  . AMPUTATION Left 11/11/2014   Procedure: Incision and drainage of left great toe;  Surgeon: Cammy CopaScott Gregory Dean, MD;  [facility];  Service: Orthopedics;  Laterality: Left;  . AMPUTATION Left 04/20/2016   Procedure: Left Great Toe Amputation;  Surgeon: Nadara Mustarduda, Carolanne Mercier V, MD;  [facility];  Service: Orthopedics;  Laterality: Left;  . FRACTURE SURGERY     right ankle   Social History   Occupational History  . Not on file  Tobacco Use  . Smoking status: Current Every Day Smoker    Packs/day: 1.00    Types: Cigarettes  . Smokeless tobacco: Never Used  . Tobacco comment: down to 5-6 cigarettes a day  Substance and Sexual Activity  . Alcohol use: No  . Drug use: No  . Sexual activity: Not on file

## 2017-06-10 ENCOUNTER — Ambulatory Visit (INDEPENDENT_AMBULATORY_CARE_PROVIDER_SITE_OTHER): Payer: Self-pay | Admitting: Orthopedic Surgery

## 2017-06-17 ENCOUNTER — Ambulatory Visit (INDEPENDENT_AMBULATORY_CARE_PROVIDER_SITE_OTHER): Payer: Self-pay | Admitting: Orthopedic Surgery

## 2017-06-17 ENCOUNTER — Encounter (INDEPENDENT_AMBULATORY_CARE_PROVIDER_SITE_OTHER): Payer: Self-pay | Admitting: Orthopedic Surgery

## 2017-06-17 VITALS — Ht 74.0 in | Wt 217.0 lb

## 2017-06-17 DIAGNOSIS — E1142 Type 2 diabetes mellitus with diabetic polyneuropathy: Secondary | ICD-10-CM

## 2017-06-17 DIAGNOSIS — L97311 Non-pressure chronic ulcer of right ankle limited to breakdown of skin: Secondary | ICD-10-CM

## 2017-06-17 NOTE — Progress Notes (Signed)
Office Visit Note   Patient: Darren Skinner           Date of Birth: January 01, 1961           MRN: [id number] Visit Date: 06/17/2017              Requested by: Daisy Floro, MD [address] [place], [place] [address] PCP: Daisy Floro, MD  Chief Complaint  Patient presents with  . Right Foot - Follow-up      HPI: Patient is a 57 year old, follow-up for ulceration right Achilles.  Is currently wearing a compression sock using antibiotic ointment.  Patient denies any redness and states that the skin has peeled away has a small scab area.  Assessment & Plan: Visit Diagnoses:  1. Diabetic polyneuropathy associated with type 2 diabetes mellitus (HCC)   2. Non-pressure chronic ulcer of right ankle, limited to breakdown of skin (HCC)     Plan: Recommended a 20-30 extra-large knee-high compression stocking.  Patient is a size 13 foot the medial sock is not tall enough for him.  You whether sock directly against the ulcer.  Follow-Up Instructions: Return if symptoms worsen or fail to improve.   Ortho Exam  Patient is alert, oriented, no adenopathy, well-dressed, normal affect, normal respiratory effort. Examination patient states his sugars are running in the 300s normally discussed the importance of proper glucose control he has good pulses there is no redness or cellulitis.  The heel ulcer is 3 mm in diameter 0.1 mm deep with a black scab there is no cellulitis no drainage no exposed bone or tendon no signs of infection.  Imaging: No results found. No images are attached to the encounter.  Labs: Lab Results  Component Value Date   HGBA1C 8.1 (H) 11/09/2014   ESRSEDRATE 56 (H) 11/09/2014   CRP 17.1 (H) 11/09/2014   REPTSTATUS 11/17/2014 FINAL 11/12/2014   REPTSTATUS 11/16/2014 FINAL 11/12/2014   GRAMSTAIN  11/12/2014    RARE WBC PRESENT,BOTH PMN AND MONONUCLEAR NO SQUAMOUS EPITHELIAL CELLS SEEN NO ORGANISMS SEEN Performed at [facility]    GRAMSTAIN   11/12/2014    RARE WBC PRESENT, PREDOMINANTLY PMN NO SQUAMOUS EPITHELIAL CELLS SEEN NO ORGANISMS SEEN Performed at [facility]    CULT  11/12/2014    NO ANAEROBES ISOLATED Performed at [facility]    CULT  11/12/2014    RARE STAPHYLOCOCCUS AUREUS Note: RIFAMPIN AND GENTAMICIN SHOULD NOT BE USED AS SINGLE DRUGS FOR TREATMENT OF STAPH INFECTIONS. This organism is presumed to be Clindamycin resistant based on detection of inducible Clindamycin resistance. Performed at [facility]    LABORGA STAPHYLOCOCCUS AUREUS 11/12/2014    (HGBA1)@  Body mass index is 27.86 kg/m.  Orders:  No orders of the defined types were placed in this encounter.  No orders of the defined types were placed in this encounter.    Procedures: No procedures performed  Clinical Data: No additional findings.  ROS:  All other systems negative, except as noted in the HPI. Review of Systems  Objective: Vital Signs: Ht  (1.88 m)   Wt 217 lb (98.4 kg)   BMI 27.86 kg/m   Specialty Comments:  No specialty comments available.  PMFS History: Patient Active Problem List   Diagnosis Date Noted  . Great toe amputation status, left (HCC) 04/27/2016  . Diabetic neuropathy with neurologic complication (HCC)   . Sepsis (HCC) 11/10/2014  . Other pancytopenia (HCC) 11/10/2014  . Type II or  unspecified type diabetes mellitus with other specified manifestations, not stated as uncontrolled   . Type II or unspecified type diabetes mellitus without mention of complication, uncontrolled   . Hypothyroidism 11/09/2014  . History of shingles 11/09/2014  . Mood disorder (HCC) 11/09/2014  . Type 2 diabetes mellitus, uncontrolled (HCC) 11/09/2014  . Diabetic foot infection (HCC) 11/08/2014  . Mass of left side of neck 10/01/2014  . Tobacco use disorder 10/01/2014  . Proteinuria 10/01/2014   Past Medical History:  Diagnosis Date  . Anemia   . Atrial fibrillation (HCC)     not on any medication at this time  . Depression   . Diabetes mellitus without complication (HCC)   . Dyspnea    due to smoking  . History of shingles 2017   in right eye   . Hypothyroidism   . Neuropathy    both feet and 1 hand  . Pneumonia   . Thyroid disease     Family History  Problem Relation Age of Onset  . Diabetes Mother   . Diabetes Father     Past Surgical History:  Procedure Laterality Date  . AMPUTATION Left 11/11/2014   Procedure: Incision and drainage of left great toe;  Surgeon: Cammy Copa, MD;  Location: [facility] OR;  Service: Orthopedics;  Laterality: Left;  . AMPUTATION Left 04/20/2016   Procedure: Left Great Toe Amputation;  Surgeon: Nadara Mustard, MD;  Location: [facility] OR;  Service: Orthopedics;  Laterality: Left;  . FRACTURE SURGERY     right ankle   Social History   Occupational History  . Not on file  Tobacco Use  . Smoking status: Current Every Day Smoker    Packs/day: 1.00    Types: Cigarettes  . Smokeless tobacco: Never Used  . Tobacco comment: down to 5-6 cigarettes a day  Substance and Sexual Activity  . Alcohol use: No  . Drug use: No  . Sexual activity: Not on file

## 2017-07-20 IMAGING — DX DG FOOT COMPLETE 3+V*L*
3 series · 3 of 3 positions shown · non-contrast
Comparison: Left great toe series 11/08/14.

CLINICAL DATA: 54-year-old male with soft tissue wound plantar
surface of the foot discovered 3 days ago. Diabetes. Initial
encounter.

EXAM:
LEFT FOOT - COMPLETE 3+ VIEW

[x foot ap left]
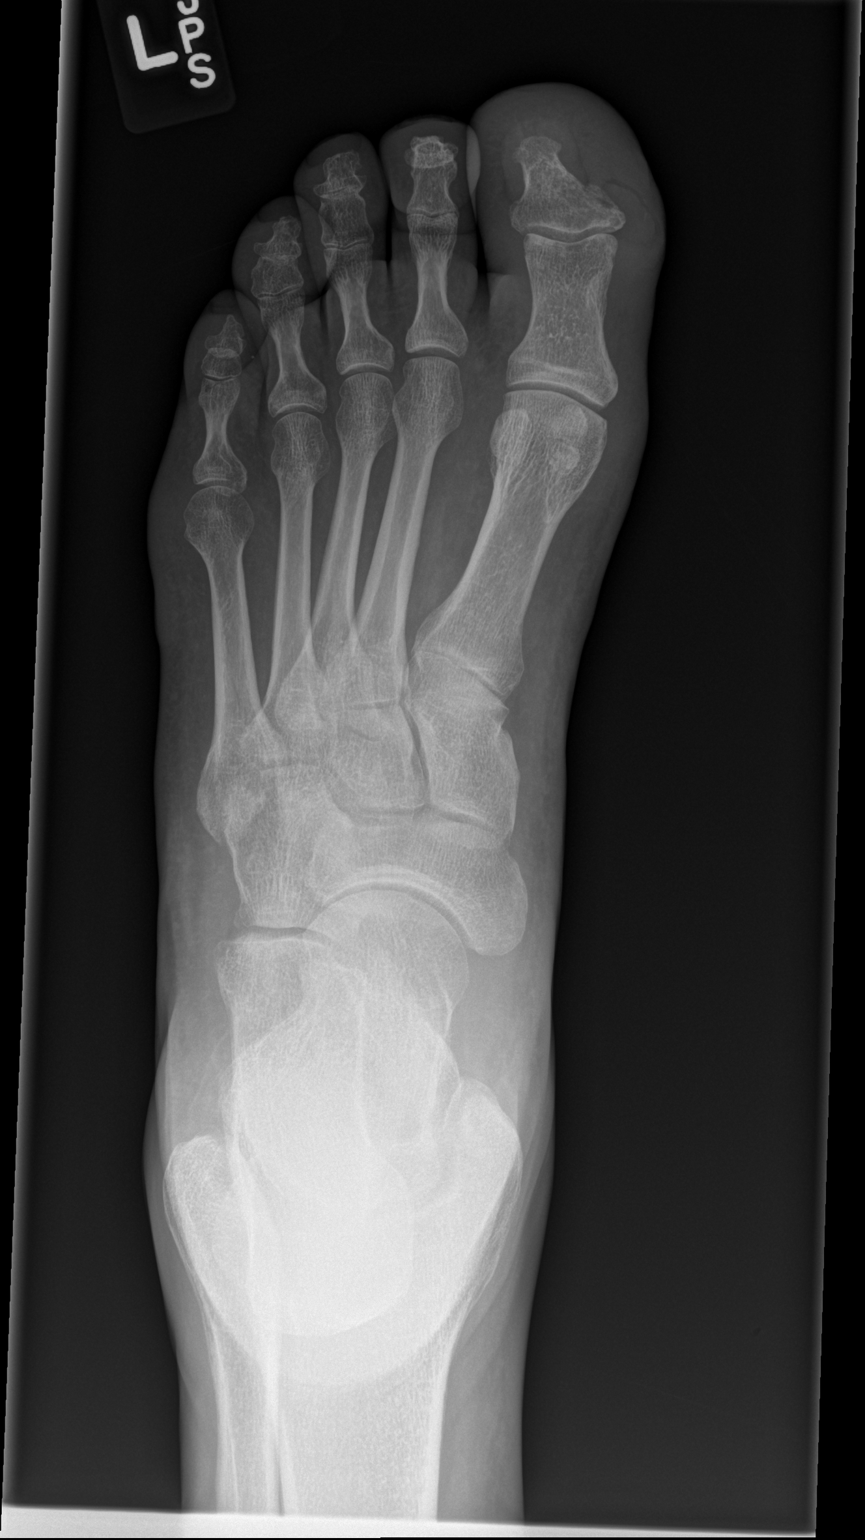

[x foot obl left]
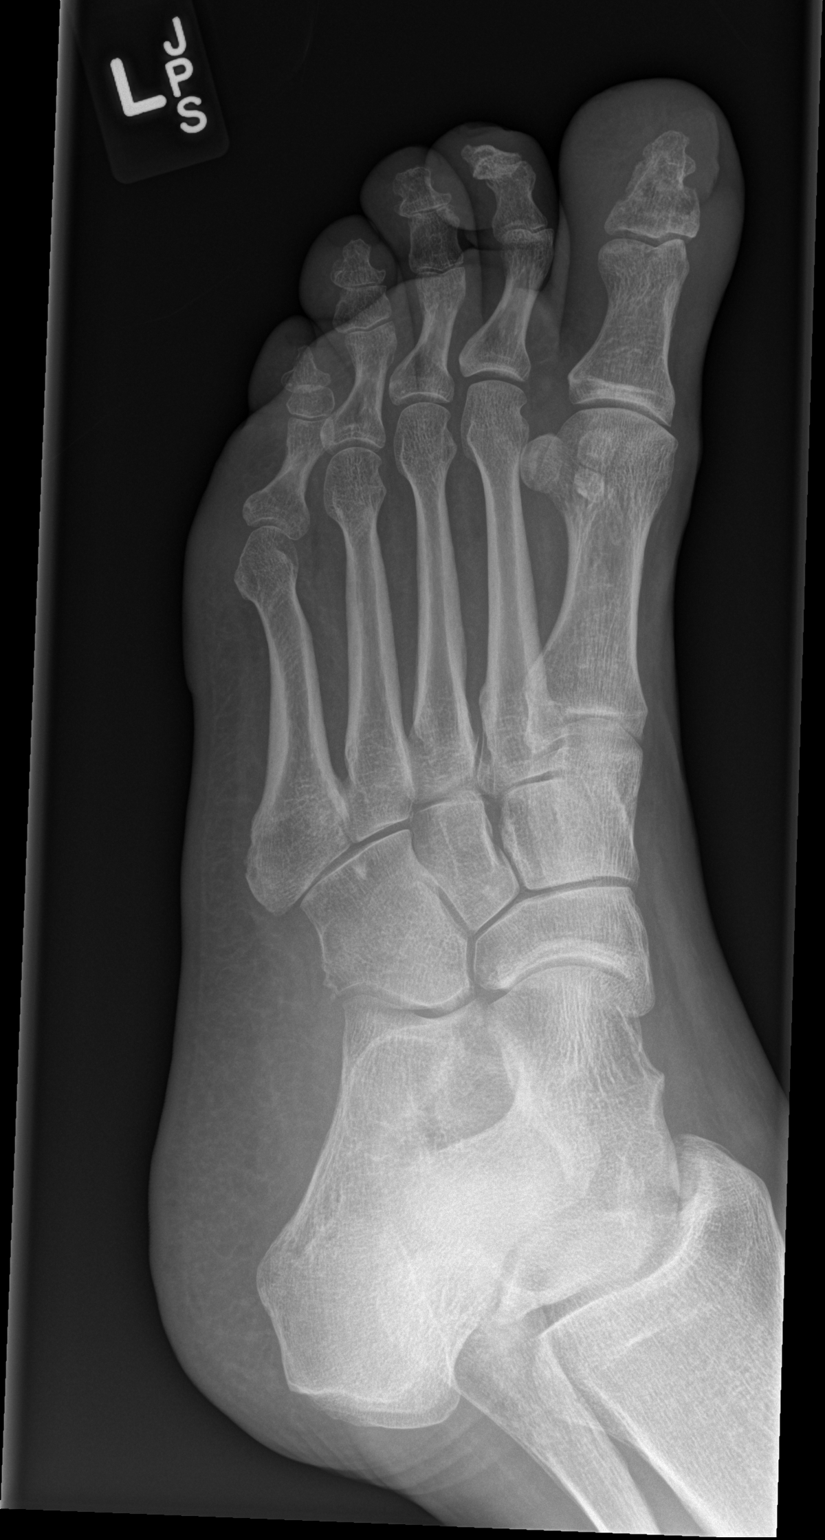

[x foot lat left]
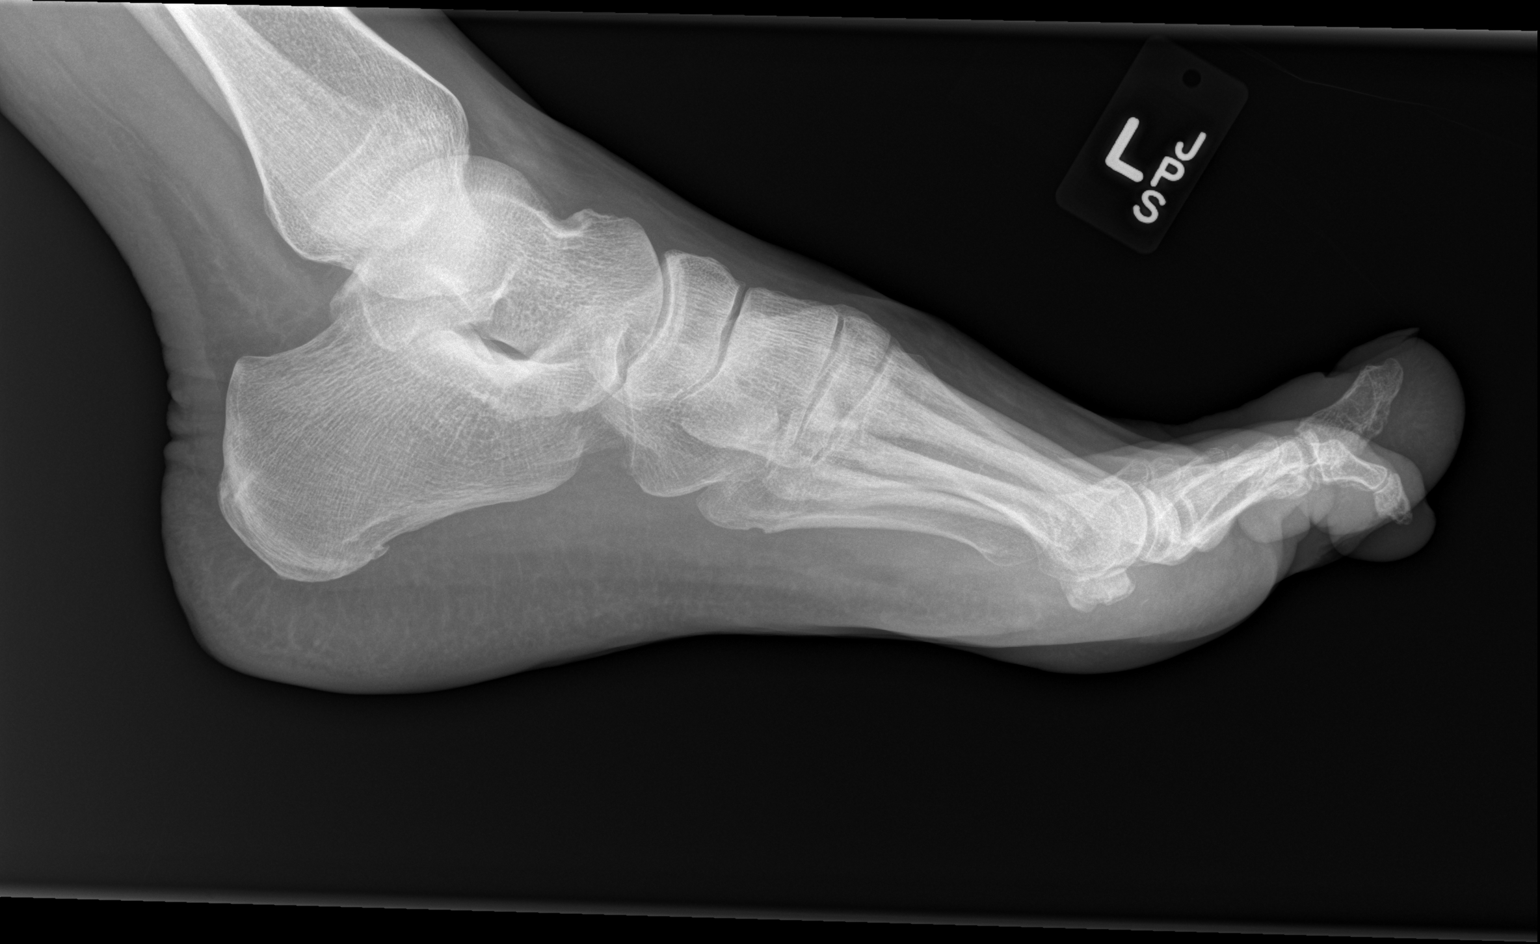

[3 of 3 positions shown; findings below may reference images not displayed]

FINDINGS: Left great toe wound still partially visible. No soft tissue
emphysema. Bone mineralization is within normal limits. Calcaneus
intact. No acute fracture, dislocation, or cortical osteolysis.
Joint spaces appear stable.
IMPRESSION: No subcutaneous gas or acute osseous abnormality identified in the
left foot.

## 2018-04-16 ENCOUNTER — Ambulatory Visit (INDEPENDENT_AMBULATORY_CARE_PROVIDER_SITE_OTHER): Payer: Self-pay | Admitting: Orthopedic Surgery

## 2018-04-17 ENCOUNTER — Ambulatory Visit (INDEPENDENT_AMBULATORY_CARE_PROVIDER_SITE_OTHER): Payer: Self-pay

## 2018-04-17 ENCOUNTER — Ambulatory Visit (INDEPENDENT_AMBULATORY_CARE_PROVIDER_SITE_OTHER): Payer: Self-pay | Admitting: Orthopedic Surgery

## 2018-04-17 ENCOUNTER — Encounter (INDEPENDENT_AMBULATORY_CARE_PROVIDER_SITE_OTHER): Payer: Self-pay | Admitting: Orthopedic Surgery

## 2018-04-17 VITALS — Ht 74.0 in | Wt 217.0 lb

## 2018-04-17 DIAGNOSIS — M79672 Pain in left foot: Secondary | ICD-10-CM

## 2018-04-17 DIAGNOSIS — M6702 Short Achilles tendon (acquired), left ankle: Secondary | ICD-10-CM

## 2018-04-17 DIAGNOSIS — L97522 Non-pressure chronic ulcer of other part of left foot with fat layer exposed: Secondary | ICD-10-CM

## 2018-04-17 MED ORDER — DOXYCYCLINE HYCLATE 100 MG PO TABS: 100.0000 mg | ORAL_TABLET | Freq: Two times a day (BID) | ORAL | 0 refills | Status: AC

## 2018-04-17 NOTE — Progress Notes (Signed)
Office Visit Note   Patient: Darren Skinner           Date of Birth: [birth year]           MRN: [id number] Visit Date: 04/17/2018              Requested by: Daisy Floro, MD [address] [place], [place] [address] PCP: Daisy Floro, MD  Chief Complaint  Patient presents with  . Left Foot - Pain, Follow-up, Edema      HPI: Patient is a 57 year old gentleman is status post a left foot great toe amputation.  Patient states he is developed redness and ulceration with fixed clawing of the second toe left foot.  Patient denies any fever or chills he is currently wearing compression socks.  Assessment & Plan: Visit Diagnoses:  1. Left foot pain     Plan: Radiograph shows no definite osteomyelitis recommended antibiotic ointment and Band-Aid recommended Achilles stretching for his heel cord contracture recommended stiff soled shoes with orthotics and a prescription written for doxycycline.  Follow-Up Instructions: No follow-ups on file.   Ortho Exam  Patient is alert, oriented, no adenopathy, well-dressed, normal affect, normal respiratory effort. Examination patient has a palpable dorsalis pedis pulse he has heel cord contracture with dorsiflexion only to neutral he was given instructions and demonstrated Achilles stretching.  He has fixed clawing of the second toe with redness and cellulitis.  There is an ulcer over the PIP joint which is 5 mm in diameter and exposed extensor tendon this does not probe to bone and there is no abscess no sausage digit swelling.  Imaging: No results found. No images are attached to the encounter.  Labs: Lab Results  Component Value Date   HGBA1C 8.1 (H) 11/09/2014   ESRSEDRATE 56 (H) 11/09/2014   CRP 17.1 (H) 11/09/2014   REPTSTATUS 11/17/2014 FINAL 11/12/2014   REPTSTATUS 11/16/2014 FINAL 11/12/2014   GRAMSTAIN  11/12/2014    RARE WBC PRESENT,BOTH PMN AND MONONUCLEAR NO SQUAMOUS EPITHELIAL CELLS SEEN NO ORGANISMS  SEEN Performed at [facility]    GRAMSTAIN  11/12/2014    RARE WBC PRESENT, PREDOMINANTLY PMN NO SQUAMOUS EPITHELIAL CELLS SEEN NO ORGANISMS SEEN Performed at [facility]    CULT  11/12/2014    NO ANAEROBES ISOLATED Performed at [facility]    CULT  11/12/2014    RARE STAPHYLOCOCCUS AUREUS Note: RIFAMPIN AND GENTAMICIN SHOULD NOT BE USED AS SINGLE DRUGS FOR TREATMENT OF STAPH INFECTIONS. This organism is presumed to be Clindamycin resistant based on detection of inducible Clindamycin resistance. Performed at [facility] STAPHYLOCOCCUS AUREUS 11/12/2014     Lab Results  Component Value Date   ALBUMIN 3.6 01/25/2015   ALBUMIN 3.0 (L) 11/09/2014   ALBUMIN 4.0 11/08/2014   PREALBUMIN 16.8 (L) 11/09/2014    Body mass index is 27.86 kg/m.  Orders:  Orders Placed This Encounter  Procedures  . XR Toe 5th Left  . XR Toe 4th Left  . XR Toe 3rd Left  . XR Toe 2nd Left   No orders of the defined types were placed in this encounter.    Procedures: No procedures performed  Clinical Data: No additional findings.  ROS:  All other systems negative, except as noted in the HPI. Review of Systems  Objective: Vital Signs: Ht 6\' 2"  (1.88 m)   Wt 217 lb (98.4 kg)   BMI 27.86 kg/m   Specialty Comments:  No specialty  comments available.  PMFS History: Patient Active Problem List   Diagnosis Date Noted  . Great toe amputation status, left 04/27/2016  . Diabetic neuropathy with neurologic complication (HCC)   . Sepsis (HCC) 11/10/2014  . Other pancytopenia (HCC) 11/10/2014  . Type II or unspecified type diabetes mellitus with other specified manifestations, not stated as uncontrolled   . Type II or unspecified type diabetes mellitus without mention of complication, uncontrolled   . Hypothyroidism 11/09/2014  . History of shingles 11/09/2014  . Mood disorder (HCC) 11/09/2014  . Type 2 diabetes mellitus, uncontrolled (HCC)  11/09/2014  . Diabetic foot infection (HCC) 11/08/2014  . Mass of left side of neck 10/01/2014  . Tobacco use disorder 10/01/2014  . Proteinuria 10/01/2014   Past Medical History:  Diagnosis Date  . Anemia   . Atrial fibrillation (HCC)    not on any medication at this time  . Depression   . Diabetes mellitus without complication (HCC)   . Dyspnea    due to smoking  . History of shingles 2017   in right eye   . Hypothyroidism   . Neuropathy    both feet and 1 hand  . Pneumonia   . Thyroid disease     Family History  Problem Relation Age of Onset  . Diabetes Mother   . Diabetes Father     Past Surgical History:  Procedure Laterality Date  . AMPUTATION Left 11/11/2014   Procedure: Incision and drainage of left great toe;  Surgeon: Cammy Copa, MD;  Location: [facility] OR;  Service: Orthopedics;  Laterality: Left;  . AMPUTATION Left 04/20/2016   Procedure: Left Great Toe Amputation;  Surgeon: Nadara Mustard, MD;  Location: [facility] OR;  Service: Orthopedics;  Laterality: Left;  . FRACTURE SURGERY     right ankle   Social History   Occupational History  . Not on file  Tobacco Use  . Smoking status: Current Every Day Smoker    Packs/day: 1.00    Types: Cigarettes  . Smokeless tobacco: Never Used  . Tobacco comment: down to 5-6 cigarettes a day  Substance and Sexual Activity  . Alcohol use: No  . Drug use: No  . Sexual activity: Not on file

## 2018-05-01 ENCOUNTER — Encounter (INDEPENDENT_AMBULATORY_CARE_PROVIDER_SITE_OTHER): Payer: Self-pay | Admitting: Orthopedic Surgery

## 2018-05-01 ENCOUNTER — Other Ambulatory Visit: Payer: Self-pay

## 2018-05-01 ENCOUNTER — Ambulatory Visit (INDEPENDENT_AMBULATORY_CARE_PROVIDER_SITE_OTHER): Payer: MEDICAID | Admitting: Physician Assistant

## 2018-05-01 VITALS — Ht 74.0 in | Wt 217.0 lb

## 2018-05-01 DIAGNOSIS — M6702 Short Achilles tendon (acquired), left ankle: Secondary | ICD-10-CM

## 2018-05-01 DIAGNOSIS — L97522 Non-pressure chronic ulcer of other part of left foot with fat layer exposed: Secondary | ICD-10-CM

## 2018-05-01 DIAGNOSIS — E1142 Type 2 diabetes mellitus with diabetic polyneuropathy: Secondary | ICD-10-CM

## 2018-05-04 ENCOUNTER — Encounter (INDEPENDENT_AMBULATORY_CARE_PROVIDER_SITE_OTHER): Payer: Self-pay | Admitting: Physician Assistant

## 2018-05-04 NOTE — Progress Notes (Signed)
Office Visit Note   Patient: Darren Skinner           Date of Birth: 1960-05-16           MRN: [id number] Visit Date: 05/01/2018              Requested by: Daisy Floro, MD [address] [place], [place] [address] PCP: Daisy Floro, MD  Chief Complaint  Patient presents with  . Left Foot - Follow-up    2nd toe      HPI: The patient is a 58 year old gentleman who is status post a left great toe amputation.  He developed some ulceration and redness with fixed clawing of the second toe of the left foot in the started on doxycycline for the past 2 weeks.  He has been trying to offload the area as much as possible.  He reports the swelling and redness are much improved and there is no drainage from the area.  He has been trying to work on Achilles stretching.  Assessment & Plan: Visit Diagnoses:  1. Ulcer of toe, left, with fat layer exposed (HCC)   2. Diabetic polyneuropathy associated with type 2 diabetes mellitus (HCC)   3. Achilles tendon contracture, left     Plan: Continue doxycycline 100 mg p.o. twice daily.  Continue to work on Achilles stretching.  Continue to offload the area is much as possible.  He can continue antibiotic ointment to the area if he desires, also continue silver compression sock.  He was given a note to remain out of work until reevaluated in about 11 days.  Follow-Up Instructions: Return in about 11 days (around 05/12/2018).   Ortho Exam  Patient is alert, oriented, no adenopathy, well-dressed, normal affect, normal respiratory effort. The left second toe dorsal ulcer has crusted over.  There is no exposed bone or tendon.  There is improvement in edema and erythema but still some degree present.  There is no sausage digit swelling currently.  He has palpable dorsalis pedis pulse.  Imaging: No results found.       Labs: Lab Results  Component Value Date   HGBA1C 8.1 (H) 11/09/2014   ESRSEDRATE 56 (H) 11/09/2014   CRP 17.1 (H)  11/09/2014   REPTSTATUS 11/17/2014 FINAL 11/12/2014   REPTSTATUS 11/16/2014 FINAL 11/12/2014   GRAMSTAIN  11/12/2014    RARE WBC PRESENT,BOTH PMN AND MONONUCLEAR NO SQUAMOUS EPITHELIAL CELLS SEEN NO ORGANISMS SEEN Performed at [facility]    GRAMSTAIN  11/12/2014    RARE WBC PRESENT, PREDOMINANTLY PMN NO SQUAMOUS EPITHELIAL CELLS SEEN NO ORGANISMS SEEN Performed at [facility]    CULT  11/12/2014    NO ANAEROBES ISOLATED Performed at [facility]    CULT  11/12/2014    RARE STAPHYLOCOCCUS AUREUS Note: RIFAMPIN AND GENTAMICIN SHOULD NOT BE USED AS SINGLE DRUGS FOR TREATMENT OF STAPH INFECTIONS. This organism is presumed to be Clindamycin resistant based on detection of inducible Clindamycin resistance. Performed at [facility] STAPHYLOCOCCUS AUREUS 11/12/2014     Lab Results  Component Value Date   ALBUMIN 3.6 01/25/2015   ALBUMIN 3.0 (L) 11/09/2014   ALBUMIN 4.0 11/08/2014   PREALBUMIN 16.8 (L) 11/09/2014    Body mass index is 27.86 kg/m.  Orders:  No orders of the defined types were placed in this encounter.  No orders of the defined types were placed in this encounter.    Procedures: No procedures performed  Clinical  Data: No additional findings.  ROS:  All other systems negative, except as noted in the HPI. Review of Systems  Objective: Vital Signs: Ht 6\' 2"  (1.88 m)   Wt 217 lb (98.4 kg)   BMI 27.86 kg/m   Specialty Comments:  No specialty comments available.  PMFS History: Patient Active Problem List   Diagnosis Date Noted  . Great toe amputation status, left 04/27/2016  . Diabetic neuropathy with neurologic complication (HCC)   . Sepsis (HCC) 11/10/2014  . Other pancytopenia (HCC) 11/10/2014  . Type II or unspecified type diabetes mellitus with other specified manifestations, not stated as uncontrolled   . Type II or unspecified type diabetes mellitus without mention of complication,  uncontrolled   . Hypothyroidism 11/09/2014  . History of shingles 11/09/2014  . Mood disorder (HCC) 11/09/2014  . Type 2 diabetes mellitus, uncontrolled (HCC) 11/09/2014  . Diabetic foot infection (HCC) 11/08/2014  . Mass of left side of neck 10/01/2014  . Tobacco use disorder 10/01/2014  . Proteinuria 10/01/2014   Past Medical History:  Diagnosis Date  . Anemia   . Atrial fibrillation (HCC)    not on any medication at this time  . Depression   . Diabetes mellitus without complication (HCC)   . Dyspnea    due to smoking  . History of shingles 2017   in right eye   . Hypothyroidism   . Neuropathy    both feet and 1 hand  . Pneumonia   . Thyroid disease     Family History  Problem Relation Age of Onset  . Diabetes Mother   . Diabetes Father     Past Surgical History:  Procedure Laterality Date  . AMPUTATION Left 11/11/2014   Procedure: Incision and drainage of left great toe;  Surgeon: Cammy Copa, MD;  Location: [facility] OR;  Service: Orthopedics;  Laterality: Left;  . AMPUTATION Left 04/20/2016   Procedure: Left Great Toe Amputation;  Surgeon: Nadara Mustard, MD;  Location: [facility] OR;  Service: Orthopedics;  Laterality: Left;  . FRACTURE SURGERY     right ankle   Social History   Occupational History  . Not on file  Tobacco Use  . Smoking status: Current Every Day Smoker    Packs/day: 1.00    Types: Cigarettes  . Smokeless tobacco: Never Used  . Tobacco comment: down to 5-6 cigarettes a day  Substance and Sexual Activity  . Alcohol use: No  . Drug use: No  . Sexual activity: Not on file

## 2018-05-12 ENCOUNTER — Telehealth (INDEPENDENT_AMBULATORY_CARE_PROVIDER_SITE_OTHER): Payer: Self-pay | Admitting: Radiology

## 2018-05-12 NOTE — Telephone Encounter (Signed)
I have called patient and asked pre-screening questions.  Do you have now or have you had in the past 7 days a fever and/or chills? NO Do you have now or have you had in the past 7 days a cough? NO Do you have now or have you had in the last 7 days nausea, vomiting or abdominal pain? NO Have you been exposed to anyone who has tested positive for COVID-19? NO Have you or anyone who lives with you traveled within the last month? NO 

## 2018-05-13 ENCOUNTER — Encounter (INDEPENDENT_AMBULATORY_CARE_PROVIDER_SITE_OTHER): Payer: Self-pay | Admitting: Orthopedic Surgery

## 2018-05-13 ENCOUNTER — Ambulatory Visit (INDEPENDENT_AMBULATORY_CARE_PROVIDER_SITE_OTHER): Payer: Self-pay | Admitting: Physician Assistant

## 2018-05-13 ENCOUNTER — Other Ambulatory Visit: Payer: Self-pay

## 2018-05-13 VITALS — Ht 74.0 in | Wt 217.0 lb

## 2018-05-13 DIAGNOSIS — E1165 Type 2 diabetes mellitus with hyperglycemia: Secondary | ICD-10-CM

## 2018-05-13 DIAGNOSIS — L97524 Non-pressure chronic ulcer of other part of left foot with necrosis of bone: Secondary | ICD-10-CM

## 2018-05-13 DIAGNOSIS — F17200 Nicotine dependence, unspecified, uncomplicated: Secondary | ICD-10-CM

## 2018-05-13 DIAGNOSIS — I4891 Unspecified atrial fibrillation: Secondary | ICD-10-CM

## 2018-05-13 DIAGNOSIS — E1142 Type 2 diabetes mellitus with diabetic polyneuropathy: Secondary | ICD-10-CM

## 2018-05-13 DIAGNOSIS — M86172 Other acute osteomyelitis, left ankle and foot: Secondary | ICD-10-CM

## 2018-05-13 NOTE — Progress Notes (Signed)
Office Visit Note   Patient: Darren Skinner           Date of Birth: 1960/08/21           MRN: [id number] Visit Date: 05/13/2018              Requested by: Daisy Floro, MD [address] [place], [place] [address] PCP: Daisy Floro, MD  Chief Complaint  Patient presents with  . Left Foot - Follow-up      HPI: The patient is a 58 year old gentleman who is status post previous left great toe amputation, who developed ulceration of his left second toe due to to a fixed claw deformity.  He has been treated with a month's worth of antibiotics and has been offloading the area is much as possible but has continued to have drainage, and now a wound which tracks to the bone.  He has insensate diabetic neuropathy.  Assessment & Plan: Visit Diagnoses:  1. Toe ulcer, left, with necrosis of bone (HCC)   2. Diabetic polyneuropathy associated with type 2 diabetes mellitus (HCC)     Plan: Discussed the progression of the patient's ulceration with evidence for osteomyelitis in his second toe.  Discussed treatment options including amputation to avoid progression were discussed with the patient and the patient does desire to proceed with amputation later this week.  He will follow-up in the office following surgery.  Follow-Up Instructions: Return in about 10 days (around 05/23/2018).   Ortho Exam  Patient is alert, oriented, no adenopathy, well-dressed, normal affect, normal respiratory effort. The patient has a fixed claw toe deformity of the second toe and she has exposed bone.  There is continued erythema and mild erythema.  His great toe amputation site is well-healed and he has good dorsalis pedis and posterior tibialis pulses.  Imaging: No results found. No images are attached to the encounter.  Labs: Lab Results  Component Value Date   HGBA1C 8.1 (H) 11/09/2014   ESRSEDRATE 56 (H) 11/09/2014   CRP 17.1 (H) 11/09/2014   REPTSTATUS 11/17/2014 FINAL 11/12/2014   REPTSTATUS 11/16/2014 FINAL 11/12/2014   GRAMSTAIN  11/12/2014    RARE WBC PRESENT,BOTH PMN AND MONONUCLEAR NO SQUAMOUS EPITHELIAL CELLS SEEN NO ORGANISMS SEEN Performed at [facility]    GRAMSTAIN  11/12/2014    RARE WBC PRESENT, PREDOMINANTLY PMN NO SQUAMOUS EPITHELIAL CELLS SEEN NO ORGANISMS SEEN Performed at [facility]    CULT  11/12/2014    NO ANAEROBES ISOLATED Performed at [facility]    CULT  11/12/2014    RARE STAPHYLOCOCCUS AUREUS Note: RIFAMPIN AND GENTAMICIN SHOULD NOT BE USED AS SINGLE DRUGS FOR TREATMENT OF STAPH INFECTIONS. This organism is presumed to be Clindamycin resistant based on detection of inducible Clindamycin resistance. Performed at [facility] STAPHYLOCOCCUS AUREUS 11/12/2014     Lab Results  Component Value Date   ALBUMIN 3.6 01/25/2015   ALBUMIN 3.0 (L) 11/09/2014   ALBUMIN 4.0 11/08/2014   PREALBUMIN 16.8 (L) 11/09/2014    Body mass index is 27.86 kg/m.  Orders:  No orders of the defined types were placed in this encounter.  No orders of the defined types were placed in this encounter.    Procedures: No procedures performed  Clinical Data: No additional findings.  ROS:  All other systems negative, except as noted in the HPI. Review of Systems  Objective: Vital Signs: Ht 6\' 2"  (1.88 m)   Wt 217 lb (  98.4 kg)   BMI 27.86 kg/m   Specialty Comments:  No specialty comments available.  PMFS History: Patient Active Problem List   Diagnosis Date Noted  . Great toe amputation status, left 04/27/2016  . Diabetic neuropathy with neurologic complication (HCC)   . Sepsis (HCC) 11/10/2014  . Other pancytopenia (HCC) 11/10/2014  . Type II or unspecified type diabetes mellitus with other specified manifestations, not stated as uncontrolled   . Type II or unspecified type diabetes mellitus without mention of complication, uncontrolled   . Hypothyroidism 11/09/2014  . History of  shingles 11/09/2014  . Mood disorder (HCC) 11/09/2014  . Type 2 diabetes mellitus, uncontrolled (HCC) 11/09/2014  . Diabetic foot infection (HCC) 11/08/2014  . Mass of left side of neck 10/01/2014  . Tobacco use disorder 10/01/2014  . Proteinuria 10/01/2014   Past Medical History:  Diagnosis Date  . Anemia   . Atrial fibrillation (HCC)    not on any medication at this time  . Depression   . Diabetes mellitus without complication (HCC)   . Dyspnea    due to smoking  . History of shingles 2017   in right eye   . Hypothyroidism   . Neuropathy    both feet and 1 hand  . Pneumonia   . Thyroid disease     Family History  Problem Relation Age of Onset  . Diabetes Mother   . Diabetes Father     Past Surgical History:  Procedure Laterality Date  . AMPUTATION Left 11/11/2014   Procedure: Incision and drainage of left great toe;  Surgeon: Cammy Copa, MD;  Location: [facility] OR;  Service: Orthopedics;  Laterality: Left;  . AMPUTATION Left 04/20/2016   Procedure: Left Great Toe Amputation;  Surgeon: Nadara Mustard, MD;  Location: [facility] OR;  Service: Orthopedics;  Laterality: Left;  . FRACTURE SURGERY     right ankle   Social History   Occupational History  . Not on file  Tobacco Use  . Smoking status: Current Every Day Smoker    Packs/day: 1.00    Types: Cigarettes  . Smokeless tobacco: Never Used  . Tobacco comment: down to 5-6 cigarettes a day  Substance and Sexual Activity  . Alcohol use: No  . Drug use: No  . Sexual activity: Not on file

## 2018-05-14 ENCOUNTER — Encounter (HOSPITAL_COMMUNITY): Payer: Self-pay | Admitting: *Deleted

## 2018-05-14 ENCOUNTER — Other Ambulatory Visit (INDEPENDENT_AMBULATORY_CARE_PROVIDER_SITE_OTHER): Payer: Self-pay | Admitting: Family

## 2018-05-14 ENCOUNTER — Other Ambulatory Visit: Payer: Self-pay

## 2018-05-14 NOTE — Progress Notes (Signed)
Spoke with patient regarding his pre-op instructions for Friday, 05/16/2018.  Patient denies SOB, chest pain, fever, cough, N/V, congestion or runny nose.  Patient instructed to stop now all vitamins, supplement, Ibuprofen, Motrin, Aleve, Goody's, BC power along with his CBD drops until after surgery.    PCP - Dr Duane Lope Cardiologist - denies  Chest x-ray - deniesa EKG -  DOS Stress Test - denies ECHO - denies Cardiac Cath - denies  Sleep Study - denies CPAP -  N/A  Patient states he does not check his blood sugars on a regular basis.  When he does check his CBG , it  Is usually between 300-400.  Patient advised to check his sugar the morning of DOS.  If CBG is less than 70, patient instructed to drink 4 oz of apple or cranberry juice.  Recheck his sugar 15 minutes later and If it's still below 70 to call us at [phone].  Patient states his last A1C was 03/2018 at Dr Charlott Rakes office.  Records requested from last office visit and labs via fax.   Patient was informed of the visitor restriction policy in place, no one allowed in hospital at this time.  Patient's brother will drop him off and pick up when he is d/c home.

## 2018-05-15 ENCOUNTER — Ambulatory Visit (INDEPENDENT_AMBULATORY_CARE_PROVIDER_SITE_OTHER): Payer: Self-pay | Admitting: Physician Assistant

## 2018-05-15 NOTE — Anesthesia Preprocedure Evaluation (Addendum)
Anesthesia Evaluation  Patient identified by MRN, date of birth, ID band Patient awake    Reviewed: Allergy & Precautions, NPO status , Patient's Chart, lab work & pertinent test results  Airway Mallampati: II  TM Distance: >3 FB Neck ROM: Full    Dental  (+) Teeth Intact, Dental Advisory Given,    Pulmonary Current Smoker,    breath sounds clear to auscultation       Cardiovascular + dysrhythmias Atrial Fibrillation  Rhythm:Regular Rate:Normal     Neuro/Psych negative neurological ROS     GI/Hepatic negative GI ROS, Neg liver ROS,   Endo/Other  diabetes, Type 2, Oral Hypoglycemic AgentsHypothyroidism   Renal/GU negative Renal ROS     Musculoskeletal   Abdominal   Peds  Hematology  (+) anemia ,   Anesthesia Other Findings   Reproductive/Obstetrics                          Lab Results  Component Value Date   WBC 7.6 04/20/2016   HGB 14.1 04/20/2016   HCT 40.9 04/20/2016   MCV 88.0 04/20/2016   PLT 226 04/20/2016   Lab Results  Component Value Date   CREATININE 1.14 04/20/2016   BUN 18 04/20/2016   NA 138 04/20/2016   K 5.0 04/20/2016   CL 105 04/20/2016   CO2 23 04/20/2016    Anesthesia Physical Anesthesia Plan  ASA: III  Anesthesia Plan: General   Post-op Pain Management:    Induction: Intravenous  PONV Risk Score and Plan: 1 and Ondansetron, Dexamethasone and Treatment may vary due to age or medical condition  Airway Management Planned: Oral ETT  Additional Equipment:   Intra-op Plan:   Post-operative Plan: Extubation in OR  Informed Consent: I have reviewed the patients History and Physical, chart, labs and discussed the procedure including the risks, benefits and alternatives for the proposed anesthesia with the patient or authorized representative who has indicated his/her understanding and acceptance.     Dental advisory given  Plan Discussed with:  CRNA  Anesthesia Plan Comments:        Anesthesia Quick Evaluation

## 2018-05-16 ENCOUNTER — Ambulatory Visit (HOSPITAL_COMMUNITY): Payer: Self-pay | Admitting: Anesthesiology

## 2018-05-16 ENCOUNTER — Ambulatory Visit (HOSPITAL_COMMUNITY)
Admission: RE | Admit: 2018-05-16 | Discharge: 2018-05-16 | Disposition: A | Discharge status: Home / Self Care | Payer: Self-pay | Source: Ambulatory Visit | Attending: Orthopedic Surgery | Admitting: Orthopedic Surgery

## 2018-05-16 ENCOUNTER — Encounter (HOSPITAL_COMMUNITY): Payer: Self-pay

## 2018-05-16 ENCOUNTER — Encounter (HOSPITAL_COMMUNITY)
Admission: RE | Disposition: A | Discharge status: Home / Self Care | Payer: Self-pay | Source: Ambulatory Visit | Attending: Orthopedic Surgery

## 2018-05-16 ENCOUNTER — Telehealth (INDEPENDENT_AMBULATORY_CARE_PROVIDER_SITE_OTHER): Payer: Self-pay | Admitting: Orthopedic Surgery

## 2018-05-16 ENCOUNTER — Other Ambulatory Visit (INDEPENDENT_AMBULATORY_CARE_PROVIDER_SITE_OTHER): Payer: Self-pay | Admitting: Physician Assistant

## 2018-05-16 ENCOUNTER — Other Ambulatory Visit: Payer: Self-pay

## 2018-05-16 DIAGNOSIS — Z89412 Acquired absence of left great toe: Secondary | ICD-10-CM | POA: Insufficient documentation

## 2018-05-16 DIAGNOSIS — E785 Hyperlipidemia, unspecified: Secondary | ICD-10-CM | POA: Insufficient documentation

## 2018-05-16 DIAGNOSIS — L97526 Non-pressure chronic ulcer of other part of left foot with bone involvement without evidence of necrosis: Secondary | ICD-10-CM | POA: Insufficient documentation

## 2018-05-16 DIAGNOSIS — M869 Osteomyelitis, unspecified: Secondary | ICD-10-CM | POA: Insufficient documentation

## 2018-05-16 DIAGNOSIS — E039 Hypothyroidism, unspecified: Secondary | ICD-10-CM | POA: Insufficient documentation

## 2018-05-16 DIAGNOSIS — Z79890 Hormone replacement therapy (postmenopausal): Secondary | ICD-10-CM | POA: Insufficient documentation

## 2018-05-16 DIAGNOSIS — Z7984 Long term (current) use of oral hypoglycemic drugs: Secondary | ICD-10-CM | POA: Insufficient documentation

## 2018-05-16 DIAGNOSIS — E1140 Type 2 diabetes mellitus with diabetic neuropathy, unspecified: Secondary | ICD-10-CM | POA: Insufficient documentation

## 2018-05-16 DIAGNOSIS — Z79899 Other long term (current) drug therapy: Secondary | ICD-10-CM | POA: Insufficient documentation

## 2018-05-16 DIAGNOSIS — L97524 Non-pressure chronic ulcer of other part of left foot with necrosis of bone: Secondary | ICD-10-CM

## 2018-05-16 DIAGNOSIS — F17210 Nicotine dependence, cigarettes, uncomplicated: Secondary | ICD-10-CM | POA: Insufficient documentation

## 2018-05-16 DIAGNOSIS — T879 Unspecified complications of amputation stump: Secondary | ICD-10-CM

## 2018-05-16 DIAGNOSIS — E1169 Type 2 diabetes mellitus with other specified complication: Secondary | ICD-10-CM | POA: Insufficient documentation

## 2018-05-16 HISTORY — DX: Presence of spectacles and contact lenses: Z97.3

## 2018-05-16 HISTORY — DX: Hyperlipidemia, unspecified: E78.5

## 2018-05-16 HISTORY — PX: AMPUTATION: SHX166

## 2018-05-16 HISTORY — DX: Nicotine dependence, unspecified, uncomplicated: F17.200

## 2018-05-16 LAB — BASIC METABOLIC PANEL
Anion gap: 10 (ref 5–15)
BUN: 22 mg/dL — ABNORMAL HIGH (ref 6–20)
CO2: 23 mmol/L (ref 22–32)
Calcium: 10 mg/dL (ref 8.9–10.3)
Chloride: 104 mmol/L (ref 98–111)
Creatinine, Ser: 1.05 mg/dL (ref 0.61–1.24)
GFR calc Af Amer: >60 mL/min (ref >60)
GFR calc non Af Amer: >60 mL/min (ref >60)
Glucose, Bld: 219 mg/dL — ABNORMAL HIGH (ref 70–99)
Potassium: 4.1 mmol/L (ref 3.5–5.1)
Sodium: 137 mmol/L (ref 135–145)

## 2018-05-16 LAB — CBC
HCT: 43.4 % (ref 39.0–52.0)
HEMOGLOBIN: 14.5 g/dL (ref 13.0–17.0)
MCH: 29.4 pg (ref 26.0–34.0)
MCHC: 33.4 g/dL (ref 30.0–36.0)
MCV: 88 fL (ref 80.0–100.0)
Platelets: 128 10*3/uL — ABNORMAL LOW (ref 150–400)
RBC: 4.93 MIL/uL (ref 4.22–5.81)
RDW: 11.4 % — ABNORMAL LOW (ref 11.5–15.5)
WBC: 6.5 10*3/uL (ref 4.0–10.5)
nRBC: 0 % (ref 0.0–0.2)

## 2018-05-16 LAB — GLUCOSE, CAPILLARY
Glucose-Capillary: 227 mg/dL — ABNORMAL HIGH (ref 70–99)
Glucose-Capillary: 213 mg/dL — ABNORMAL HIGH (ref 70–99)

## 2018-05-16 SURGERY — AMPUTATION DIGIT
Anesthesia: General | Site: Foot | Laterality: Left

## 2018-05-16 MED ORDER — HYDROCODONE-ACETAMINOPHEN 5-325 MG PO TABS
ORAL_TABLET | ORAL | Status: AC
  Filled 2018-05-16: qty 1

## 2018-05-16 MED ORDER — MIDAZOLAM HCL 2 MG/2ML IJ SOLN
INTRAMUSCULAR | Status: AC
  Filled 2018-05-16: qty 2

## 2018-05-16 MED ORDER — EPHEDRINE SULFATE-NACL 50-0.9 MG/10ML-% IV SOSY
PREFILLED_SYRINGE | INTRAVENOUS | Status: DC | PRN
  Administered 2018-05-16: 5 mg via INTRAVENOUS

## 2018-05-16 MED ORDER — ONDANSETRON HCL 4 MG/2ML IJ SOLN
INTRAMUSCULAR | Status: DC | PRN
  Administered 2018-05-16: 4 mg via INTRAVENOUS

## 2018-05-16 MED ORDER — SUCCINYLCHOLINE CHLORIDE 200 MG/10ML IV SOSY
PREFILLED_SYRINGE | INTRAVENOUS | Status: AC
  Filled 2018-05-16: qty 10

## 2018-05-16 MED ORDER — PROPOFOL 10 MG/ML IV BOLUS
INTRAVENOUS | Status: AC
  Filled 2018-05-16: qty 40

## 2018-05-16 MED ORDER — LIDOCAINE 2% (20 MG/ML) 5 ML SYRINGE
INTRAMUSCULAR | Status: DC | PRN
  Administered 2018-05-16: 60 mg via INTRAVENOUS

## 2018-05-16 MED ORDER — HYDROCODONE-ACETAMINOPHEN 5-325 MG PO TABS
1.0000 | ORAL_TABLET | Freq: Four times a day (QID) | ORAL | Status: DC | PRN
  Administered 2018-05-16: 1 via ORAL

## 2018-05-16 MED ORDER — FENTANYL CITRATE (PF) 250 MCG/5ML IJ SOLN
INTRAMUSCULAR | Status: DC | PRN
  Administered 2018-05-16: 50 ug via INTRAVENOUS

## 2018-05-16 MED ORDER — HYDROCODONE-ACETAMINOPHEN 5-325 MG PO TABS: 1.0000 | ORAL_TABLET | ORAL | 0 refills | Status: DC | PRN

## 2018-05-16 MED ORDER — CEFAZOLIN SODIUM-DEXTROSE 2-4 GM/100ML-% IV SOLN
2.0000 g | INTRAVENOUS | Status: AC
  Administered 2018-05-16: 2 g via INTRAVENOUS
  Filled 2018-05-16: qty 100

## 2018-05-16 MED ORDER — DEXAMETHASONE SODIUM PHOSPHATE 10 MG/ML IJ SOLN
INTRAMUSCULAR | Status: AC
  Filled 2018-05-16: qty 1

## 2018-05-16 MED ORDER — FENTANYL CITRATE (PF) 250 MCG/5ML IJ SOLN
INTRAMUSCULAR | Status: AC
  Filled 2018-05-16: qty 5

## 2018-05-16 MED ORDER — MIDAZOLAM HCL 5 MG/5ML IJ SOLN
INTRAMUSCULAR | Status: DC | PRN
  Administered 2018-05-16: 2 mg via INTRAVENOUS

## 2018-05-16 MED ORDER — LACTATED RINGERS IV SOLN
INTRAVENOUS | Status: DC | PRN
  Administered 2018-05-16: 07:00:00 via INTRAVENOUS

## 2018-05-16 MED ORDER — PROPOFOL 10 MG/ML IV BOLUS
INTRAVENOUS | Status: DC | PRN
  Administered 2018-05-16: 200 mg via INTRAVENOUS

## 2018-05-16 MED ORDER — CHLORHEXIDINE GLUCONATE 4 % EX LIQD: 60.0000 mL | Freq: Once | CUTANEOUS | Status: DC

## 2018-05-16 MED ORDER — EPHEDRINE 5 MG/ML INJ
INTRAVENOUS | Status: AC
  Filled 2018-05-16: qty 10

## 2018-05-16 MED ORDER — HYDROCODONE-ACETAMINOPHEN 5-325 MG PO TABS: 1.0000 | ORAL_TABLET | Freq: Four times a day (QID) | ORAL | 0 refills | Status: AC | PRN

## 2018-05-16 MED ORDER — HYDROCODONE-ACETAMINOPHEN 5-325 MG PO TABS: 1.0000 | ORAL_TABLET | ORAL | 0 refills | Status: AC | PRN

## 2018-05-16 MED ORDER — SUCCINYLCHOLINE CHLORIDE 200 MG/10ML IV SOSY
PREFILLED_SYRINGE | INTRAVENOUS | Status: DC | PRN
  Administered 2018-05-16: 140 mg via INTRAVENOUS

## 2018-05-16 MED ORDER — ONDANSETRON HCL 4 MG/2ML IJ SOLN
INTRAMUSCULAR | Status: AC
  Filled 2018-05-16: qty 2

## 2018-05-16 MED ORDER — LIDOCAINE 2% (20 MG/ML) 5 ML SYRINGE
INTRAMUSCULAR | Status: AC
  Filled 2018-05-16: qty 5

## 2018-05-16 MED ORDER — 0.9 % SODIUM CHLORIDE (POUR BTL) OPTIME
TOPICAL | Status: DC | PRN
  Administered 2018-05-16: 1000 mL

## 2018-05-16 SURGICAL SUPPLY — 31 items
APL PRP STRL LF DISP 70% ISPRP (MISCELLANEOUS) ×1
BLADE SURG 21 STRL SS (BLADE) ×3 IMPLANT
BNDG CMPR 9X4 STRL LF SNTH (GAUZE/BANDAGES/DRESSINGS)
BNDG COHESIVE 4X5 TAN STRL (GAUZE/BANDAGES/DRESSINGS) ×3 IMPLANT
BNDG ESMARK 4X9 LF (GAUZE/BANDAGES/DRESSINGS) IMPLANT
BNDG GAUZE ELAST 4 BULKY (GAUZE/BANDAGES/DRESSINGS) ×3 IMPLANT
CHLORAPREP W/TINT 26 (MISCELLANEOUS) ×2 IMPLANT
COVER SURGICAL LIGHT HANDLE (MISCELLANEOUS) ×6 IMPLANT
COVER WAND RF STERILE (DRAPES) ×3 IMPLANT
DRAPE U-SHAPE 47X51 STRL (DRAPES) ×3 IMPLANT
DRSG ADAPTIC 3X8 NADH LF (GAUZE/BANDAGES/DRESSINGS) ×2 IMPLANT
DRSG PAD ABDOMINAL 8X10 ST (GAUZE/BANDAGES/DRESSINGS) ×3 IMPLANT
ELECT REM PT RETURN 9FT ADLT (ELECTROSURGICAL) ×3
ELECTRODE REM PT RTRN 9FT ADLT (ELECTROSURGICAL) ×1 IMPLANT
GAUZE SPONGE 4X4 12PLY STRL (GAUZE/BANDAGES/DRESSINGS) ×2 IMPLANT
GLOVE BIOGEL PI IND STRL 9 (GLOVE) ×1 IMPLANT
GLOVE BIOGEL PI INDICATOR 9 (GLOVE) ×2
GLOVE SURG ORTHO 9.0 STRL STRW (GLOVE) ×3 IMPLANT
GOWN STRL REUS W/ TWL XL LVL3 (GOWN DISPOSABLE) ×2 IMPLANT
GOWN STRL REUS W/TWL XL LVL3 (GOWN DISPOSABLE) ×6
KIT BASIN OR (CUSTOM PROCEDURE TRAY) ×3 IMPLANT
KIT TURNOVER KIT B (KITS) ×3 IMPLANT
MANIFOLD NEPTUNE II (INSTRUMENTS) ×3 IMPLANT
NEEDLE 22X1 1/2 (OR ONLY) (NEEDLE) IMPLANT
NS IRRIG 1000ML POUR BTL (IV SOLUTION) ×3 IMPLANT
PACK ORTHO EXTREMITY (CUSTOM PROCEDURE TRAY) ×3 IMPLANT
PAD ARMBOARD 7.5X6 YLW CONV (MISCELLANEOUS) ×6 IMPLANT
SUT ETHILON 2 0 PSLX (SUTURE) ×3 IMPLANT
SYR CONTROL 10ML LL (SYRINGE) IMPLANT
TOWEL OR 17X26 10 PK STRL BLUE (TOWEL DISPOSABLE) ×3 IMPLANT
YANKAUER SUCT BULB TIP NO VENT (SUCTIONS) ×2 IMPLANT

## 2018-05-16 NOTE — Telephone Encounter (Signed)
Please see message below and let me know

## 2018-05-16 NOTE — Transfer of Care (Signed)
Immediate Anesthesia Transfer of Care Note  Patient: Darren Skinner  Procedure(s) Performed: LEFT 2ND TOE AMPUTATION (Left Foot)  Patient Location: PACU  Anesthesia Type:General  Level of Consciousness: awake, alert  and oriented  Airway & Oxygen Therapy: Patient Spontanous Breathing and Patient connected to face mask oxygen  Post-op Assessment: Report given to RN, Post -op Vital signs reviewed and stable and Patient moving all extremities X 4  Post vital signs: Reviewed and stable  Last Vitals:  Vitals Value Taken Time  BP    Temp    Pulse 81 05/16/2018  8:14 AM  Resp 16 05/16/2018  8:14 AM  SpO2 96 % 05/16/2018  8:14 AM  Vitals shown include unvalidated device data.  Last Pain:  Vitals:   05/16/18 0608  TempSrc:   PainSc: 0-No pain         Complications: No apparent anesthesia complications

## 2018-05-16 NOTE — H&P (Signed)
Darren Skinner is an 58 y.o. male.   Chief Complaint: Osteomyelitis left second toe HPI: The patient is a 58 year old gentleman who is status post a previous left great toe amputation who developed ulceration of his left second toe due to a fixed claw toe deformity.  He has been treated conservatively with antibiotics and offloading the area as much as possible but the wound now tracks to the bone and he presents for left second toe amputation.  Past Medical History:  Diagnosis Date  . Anemia   . Atrial fibrillation (HCC)    not on any medication at this time - long time ago per patient  . Depression   . Diabetes mellitus without complication (HCC)    type 2  . Dyspnea    due to smoking  . History of shingles 2017   in right eye   . Hyperlipidemia   . Hypothyroidism   . Neuropathy    both feet   . Pneumonia   . Smoker   . Thyroid disease   . Wears glasses    reading    Past Surgical History:  Procedure Laterality Date  . AMPUTATION Left 11/11/2014   Procedure: Incision and drainage of left great toe;  Surgeon: Cammy Copa, MD;  Location: [facility] OR;  Service: Orthopedics;  Laterality: Left;  . AMPUTATION Left 04/20/2016   Procedure: Left Great Toe Amputation;  Surgeon: Nadara Mustard, MD;  Location: [facility] OR;  Service: Orthopedics;  Laterality: Left;  . FRACTURE SURGERY     right ankle  . KNEE SURGERY Left    arthroscopic  . WISDOM TOOTH EXTRACTION      Family History  Problem Relation Age of Onset  . Diabetes Mother   . Diabetes Father    Social History:  reports that he has been smoking cigarettes. He has a 40.00 pack-year smoking history. He has never used smokeless tobacco. He reports that he does not drink alcohol or use drugs.  Allergies: No Known Allergies  Medications Prior to Admission  Medication Sig Dispense Refill  . doxycycline (VIBRA-TABS) 100 MG tablet Take 1 tablet (100 mg total) by mouth 2 (two) times daily. 60 tablet 0  . gabapentin (NEURONTIN) 800 MG  tablet Take 1,600 mg by mouth 2 (two) times daily.     Marland Kitchen glimepiride (AMARYL) 4 MG tablet Take 4 mg by mouth daily with breakfast.     . levothyroxine (SYNTHROID, LEVOTHROID) 200 MCG tablet Take 200 mcg by mouth daily before breakfast.     . metFORMIN (GLUCOPHAGE-XR) 500 MG 24 hr tablet Take 1,000 mg by mouth 2 (two) times daily with a meal.     . pioglitazone (ACTOS) 15 MG tablet Take 15 mg by mouth daily.    . pravastatin (PRAVACHOL) 20 MG tablet Take 20 mg by mouth daily.    Marland Kitchen HYDROcodone-acetaminophen (NORCO) 5-325 MG tablet Take 1 tablet by mouth every 6 (six) hours as needed. (Patient not taking: Reported on 05/14/2018) 30 tablet 0  . HYDROcodone-acetaminophen (NORCO/VICODIN) 5-325 MG tablet Take 1 tablet by mouth every 6 (six) hours as needed for moderate pain. (Patient not taking: Reported on 05/14/2018) 30 tablet 0  . l-methylfolate-B6-B12 (METANX) 3-35-2 MG TABS tablet Take 1 tablet by mouth 2 (two) times daily.      Results for orders placed or performed during the hospital encounter of 05/16/18 (from the past 48 hour(s))  Glucose, capillary     Status: Abnormal   Collection Time: 05/16/18  5:49 AM  Result Value Ref Range   Glucose-Capillary 227 (H) 70 - 99 mg/dL  CBC     Status: Abnormal   Collection Time: 05/16/18  5:51 AM  Result Value Ref Range   WBC 6.5 4.0 - 10.5 K/uL   RBC 4.93 4.22 - 5.81 MIL/uL   Hemoglobin 14.5 13.0 - 17.0 g/dL   HCT 00.3 70.4 - 88.8 %   MCV 88.0 80.0 - 100.0 fL   MCH 29.4 26.0 - 34.0 pg   MCHC 33.4 30.0 - 36.0 g/dL   RDW 91.6 (L) 94.5 - 03.8 %   Platelets 128 (L) 150 - 400 K/uL    Comment: REPEATED TO VERIFY   nRBC 0.0 0.0 - 0.2 %    Comment: Performed at [facility], [address], [place], [place] [address]   No results found.  Review of Systems  All other systems reviewed and are negative.   Blood pressure 126/78, pulse 76, temperature 97.8 F (36.6 C), temperature source Oral, resp. rate 20, height 6\' 2"  (1.88 m), weight 104.3  kg, SpO2 94 %. Physical Exam  Constitutional: He is oriented to person, place, and time. He appears well-developed and well-nourished. No distress.  HENT:  Head: Normocephalic and atraumatic.  Neck: No tracheal deviation present. No thyromegaly present.  Cardiovascular: Normal rate and intact distal pulses.  Respiratory: Effort normal. No respiratory distress.  GI: Soft.  Musculoskeletal:     Comments: The patient has a fixed claw toe deformity of the second toe and she has exposed bone.  There is continued erythema and mild erythema.  His great toe amputation site is well-healed and he has good dorsalis pedis and posterior tibialis pulses.  Neurological: He is alert and oriented to person, place, and time. No cranial nerve deficit. He exhibits normal muscle tone. Coordination normal.  Skin: Skin is warm.  Psychiatric: He has a normal mood and affect. His behavior is normal. Judgment and thought content normal.     Assessment/Plan Left second toe osteomyelitis with exposed bone-plan left second toe amputation.  The procedure and risks and benefits were discussed with the patient including the risks of bleeding, infection, neurovascular injury, and possible need for further surgery was discussed with the patient and the patient desires to proceed at this time.  Lazaro Arms, PA-C 05/16/2018, 7:02 AM [facility] [phone]

## 2018-05-16 NOTE — Telephone Encounter (Signed)
PT is asking if he can have his medication sent to [facility]  at [facility] instead of [facility]

## 2018-05-16 NOTE — Anesthesia Procedure Notes (Signed)
Procedure Name: Intubation Date/Time: 05/16/2018 7:50 AM Performed by: Nils Pyle, CRNA Pre-anesthesia Checklist: Patient identified, Emergency Drugs available, Suction available and Patient being monitored Patient Re-evaluated:Patient Re-evaluated prior to induction Oxygen Delivery Method: Circle System Utilized Preoxygenation: Pre-oxygenation with 100% oxygen Induction Type: IV induction and Rapid sequence Laryngoscope Size: Miller and 2 Grade View: Grade I Tube type: Oral Tube size: 7.5 mm Number of attempts: 1 Airway Equipment and Method: Stylet and Oral airway Placement Confirmation: ETT inserted through vocal cords under direct vision,  positive ETCO2 and breath sounds checked- equal and bilateral Secured at: 23 cm Tube secured with: Tape Dental Injury: Teeth and Oropharynx as per pre-operative assessment

## 2018-05-16 NOTE — Op Note (Signed)
05/16/2018  8:11 AM  PATIENT:  Darren Skinner    PRE-OPERATIVE DIAGNOSIS:  Left 2nd Toe Osteomyelitis  POST-OPERATIVE DIAGNOSIS:  Same  PROCEDURE:  LEFT 2ND TOE AMPUTATION  SURGEON:  Nadara Mustard, MD  PHYSICIAN ASSISTANT:None ANESTHESIA:   General  PREOPERATIVE INDICATIONS:  [name] is a  58 y.o. male with a diagnosis of Left 2nd Toe Osteomyelitis who failed conservative measures and elected for surgical management.    The risks benefits and alternatives were discussed with the patient preoperatively including but not limited to the risks of infection, bleeding, nerve injury, cardiopulmonary complications, the need for revision surgery, among others, and the patient was willing to proceed.  OPERATIVE IMPLANTS: none  @ENCIMAGES @  OPERATIVE FINDINGS: No abscess or infection at the MTP joint  OPERATIVE PROCEDURE: Patient was brought the operating room and underwent general anesthetic.  After adequate levels anesthesia were obtained patient's left lower extremity was prepped using ChloraPrep and draped into a sterile field a timeout was called.  A fishmouth incision was made just distal to the MTP joint.  This was carried down to the toe was amputated through the MTP joint.  Electrocautery was used for hemostasis there was no signs of infection.  The wound was irrigated with normal saline the incision was closed using 2-0 nylon a sterile dressing was applied patient was extubated taken the PACU in stable condition.   DISCHARGE PLANNING:  Antibiotic duration: Preoperative antibiotics  Weightbearing: Touchdown weightbearing on the left  Pain medication: Prescription electronically sent for Percocet  Dressing care/ Wound VAC: Dry dressing change and follow-up  Ambulatory devices: Walker or crutches  Discharge to: Home.  Follow-up: In the office 1 week post operative.

## 2018-05-16 NOTE — Anesthesia Postprocedure Evaluation (Signed)
Anesthesia Post Note  Patient: Darren Skinner  Procedure(s) Performed: LEFT 2ND TOE AMPUTATION (Left Foot)     Patient location during evaluation: PACU Anesthesia Type: General Level of consciousness: awake and alert Pain management: pain level controlled Vital Signs Assessment: post-procedure vital signs reviewed and stable Respiratory status: spontaneous breathing, nonlabored ventilation, respiratory function stable and patient connected to nasal cannula oxygen Cardiovascular status: blood pressure returned to baseline and stable Postop Assessment: no apparent nausea or vomiting Anesthetic complications: no    Last Vitals:  Vitals:   05/16/18 0845 05/16/18 0859  BP: (!) 119/91 120/83  Pulse: 73 71  Resp: 16 17  Temp:  36.5 C  SpO2: 94% 95%    Last Pain:  Vitals:   05/16/18 0900  TempSrc:   PainSc: 0-No pain                 Kennieth Rad

## 2018-05-16 NOTE — Progress Notes (Signed)
EKG done today - abnormal  Notified Dr. Maple Hudson (on call last night).  Stated he will leave decision up to Dr. Chaney Malling.

## 2018-05-16 NOTE — Telephone Encounter (Signed)
Called pt to advise

## 2018-05-16 NOTE — Telephone Encounter (Signed)
Rx sent to pharmacy requested Darren Skinner and Rx cancelled at [place] [facility].  Please let patient know.

## 2018-05-17 ENCOUNTER — Encounter (HOSPITAL_COMMUNITY): Payer: Self-pay | Admitting: Orthopedic Surgery

## 2018-05-22 ENCOUNTER — Telehealth (INDEPENDENT_AMBULATORY_CARE_PROVIDER_SITE_OTHER): Payer: Self-pay | Admitting: Radiology

## 2018-05-22 NOTE — Telephone Encounter (Signed)
Called and spoke with patient, patient answered NO to all pre screening questions for appointment on 4/6 

## 2018-05-26 ENCOUNTER — Ambulatory Visit (INDEPENDENT_AMBULATORY_CARE_PROVIDER_SITE_OTHER): Payer: Self-pay | Admitting: Orthopedic Surgery

## 2018-05-26 ENCOUNTER — Other Ambulatory Visit: Payer: Self-pay

## 2018-05-26 ENCOUNTER — Encounter (INDEPENDENT_AMBULATORY_CARE_PROVIDER_SITE_OTHER): Payer: Self-pay | Admitting: Orthopedic Surgery

## 2018-05-26 VITALS — Ht 74.0 in | Wt 230.0 lb

## 2018-05-26 DIAGNOSIS — S98132D Complete traumatic amputation of one left lesser toe, subsequent encounter: Secondary | ICD-10-CM

## 2018-05-26 DIAGNOSIS — S98132A Complete traumatic amputation of one left lesser toe, initial encounter: Secondary | ICD-10-CM

## 2018-05-26 NOTE — Progress Notes (Signed)
Office Visit Note   Patient: Darren Skinner           Date of Birth: 06/18/1960           MRN: [id number] Visit Date: 05/26/2018              Requested by: Daisy Floro, MD [address] [place], [place] [address] PCP: Daisy Floro, MD  Chief Complaint  Patient presents with  . Left Foot - Routine Post Op    05/16/2018 left foot 2nd toe amp      HPI: Patient is a 58 year old gentleman who is seen in follow-up status post amputation of left foot second toe.  Patient states he had some errands to do and he has been up walking on his foot a lot.  Patient states he has some swelling.  Assessment & Plan: Visit Diagnoses:  1. Amputated toe of left foot (HCC)     Plan: Recommended minimize weightbearing recommend that he wear his medical compression stocking recommended elevation.  Follow-Up Instructions: Return in about 1 week (around 06/02/2018).   Ortho Exam  Patient is alert, oriented, no adenopathy, well-dressed, normal affect, normal respiratory effort. Examination there is slight wound dehiscence there is 3 mm of widening of the surgical incision with good granulation tissue there is some mild maceration no cellulitis no purulence no drainage no signs of infection.  Patient has a strong dorsalis pedis pulse.  Imaging: No results found. No images are attached to the encounter.  Labs: Lab Results  Component Value Date   HGBA1C 8.1 (H) 11/09/2014   ESRSEDRATE 56 (H) 11/09/2014   CRP 17.1 (H) 11/09/2014   REPTSTATUS 11/17/2014 FINAL 11/12/2014   REPTSTATUS 11/16/2014 FINAL 11/12/2014   GRAMSTAIN  11/12/2014    RARE WBC PRESENT,BOTH PMN AND MONONUCLEAR NO SQUAMOUS EPITHELIAL CELLS SEEN NO ORGANISMS SEEN Performed at [facility]    GRAMSTAIN  11/12/2014    RARE WBC PRESENT, PREDOMINANTLY PMN NO SQUAMOUS EPITHELIAL CELLS SEEN NO ORGANISMS SEEN Performed at [facility]    CULT  11/12/2014    NO ANAEROBES ISOLATED Performed at [facility]    CULT  11/12/2014    RARE STAPHYLOCOCCUS AUREUS Note: RIFAMPIN AND GENTAMICIN SHOULD NOT BE USED AS SINGLE DRUGS FOR TREATMENT OF STAPH INFECTIONS. This organism is presumed to be Clindamycin resistant based on detection of inducible Clindamycin resistance. Performed at [facility] STAPHYLOCOCCUS AUREUS 11/12/2014     Lab Results  Component Value Date   ALBUMIN 3.6 01/25/2015   ALBUMIN 3.0 (L) 11/09/2014   ALBUMIN 4.0 11/08/2014   PREALBUMIN 16.8 (L) 11/09/2014    Body mass index is 29.53 kg/m.  Orders:  No orders of the defined types were placed in this encounter.  No orders of the defined types were placed in this encounter.    Procedures: No procedures performed  Clinical Data: No additional findings.  ROS:  All other systems negative, except as noted in the HPI. Review of Systems  Objective: Vital Signs: Ht 6\' 2"  (1.88 m)   Wt 230 lb (104.3 kg)   BMI 29.53 kg/m   Specialty Comments:  No specialty comments available.  PMFS History: Patient Active Problem List   Diagnosis Date Noted  . Great toe amputation status, left 04/27/2016  . Osteomyelitis of second toe of left foot (HCC)   . Diabetic neuropathy with neurologic complication (HCC)   . Sepsis (HCC) 11/10/2014  . Other pancytopenia (HCC)  11/10/2014  . Type II or unspecified type diabetes mellitus with other specified manifestations, not stated as uncontrolled   . Type II or unspecified type diabetes mellitus without mention of complication, uncontrolled   . Hypothyroidism 11/09/2014  . History of shingles 11/09/2014  . Mood disorder (HCC) 11/09/2014  . Type 2 diabetes mellitus, uncontrolled (HCC) 11/09/2014  . Diabetic foot infection (HCC) 11/08/2014  . Mass of left side of neck 10/01/2014  . Tobacco use disorder 10/01/2014  . Proteinuria 10/01/2014   Past Medical History:  Diagnosis Date  . Anemia   . Atrial fibrillation (HCC)    not on any medication at  this time - long time ago per patient  . Depression   . Diabetes mellitus without complication (HCC)    type 2  . Dyspnea    due to smoking  . History of shingles 2017   in right eye   . Hyperlipidemia   . Hypothyroidism   . Neuropathy    both feet   . Pneumonia   . Smoker   . Thyroid disease   . Wears glasses    reading    Family History  Problem Relation Age of Onset  . Diabetes Mother   . Diabetes Father     Past Surgical History:  Procedure Laterality Date  . AMPUTATION Left 11/11/2014   Procedure: Incision and drainage of left great toe;  Surgeon: Cammy Copa, MD;  Location: [facility] OR;  Service: Orthopedics;  Laterality: Left;  . AMPUTATION Left 04/20/2016   Procedure: Left Great Toe Amputation;  Surgeon: Nadara Mustard, MD;  Location: [facility] OR;  Service: Orthopedics;  Laterality: Left;  . AMPUTATION Left 05/16/2018   Procedure: LEFT 2ND TOE AMPUTATION;  Surgeon: Nadara Mustard, MD;  Location: [facility] OR;  Service: Orthopedics;  Laterality: Left;  . FRACTURE SURGERY     right ankle  . KNEE SURGERY Left    arthroscopic  . WISDOM TOOTH EXTRACTION     Social History   Occupational History  . Not on file  Tobacco Use  . Smoking status: Current Every Day Smoker    Packs/day: 1.00    Years: 40.00    Pack years: 40.00    Types: Cigarettes  . Smokeless tobacco: Never Used  Substance and Sexual Activity  . Alcohol use: No  . Drug use: No  . Sexual activity: Not on file

## 2018-05-29 ENCOUNTER — Telehealth (INDEPENDENT_AMBULATORY_CARE_PROVIDER_SITE_OTHER): Payer: Self-pay

## 2018-05-29 NOTE — Telephone Encounter (Signed)
Called and sw pt. He answered all COVID-19 prescreen questions NO. Pt has an appt on Monday.

## 2018-06-02 ENCOUNTER — Ambulatory Visit (INDEPENDENT_AMBULATORY_CARE_PROVIDER_SITE_OTHER): Payer: Self-pay | Admitting: Physician Assistant

## 2018-06-02 ENCOUNTER — Encounter (INDEPENDENT_AMBULATORY_CARE_PROVIDER_SITE_OTHER): Payer: Self-pay | Admitting: Physician Assistant

## 2018-06-02 ENCOUNTER — Other Ambulatory Visit: Payer: Self-pay

## 2018-06-02 ENCOUNTER — Encounter (INDEPENDENT_AMBULATORY_CARE_PROVIDER_SITE_OTHER): Payer: Self-pay | Admitting: Orthopedic Surgery

## 2018-06-02 VITALS — Ht 74.0 in | Wt 230.0 lb

## 2018-06-02 DIAGNOSIS — S98132A Complete traumatic amputation of one left lesser toe, initial encounter: Secondary | ICD-10-CM

## 2018-06-02 DIAGNOSIS — Z89422 Acquired absence of other left toe(s): Secondary | ICD-10-CM

## 2018-06-02 DIAGNOSIS — E1142 Type 2 diabetes mellitus with diabetic polyneuropathy: Secondary | ICD-10-CM

## 2018-06-02 NOTE — Progress Notes (Signed)
Office Visit Note   Patient: Darren Skinner           Date of Birth: 17-Nov-1960           MRN: [id number] Visit Date: 06/02/2018              Requested by: Daisy Florooss, Charles Alan, MD [address] [place], [place] [id number] PCP: Daisy Florooss, Charles Alan, MD  Chief Complaint  Patient presents with  . Left Foot - Routine Post Op    05/16/18 left foot 2nd toe amputation       HPI: Patient is a 58 year old gentleman who is seen for postoperative follow-up following left second toe amputation for osteomyelitis on 05/16/2018.  He is continued to have some mild oozing from the incisional area.  He has been wearing a silver compression sock around-the-clock except for washing the area with soap and water.  He is not having any discomfort over the area.  He is walking full weightbearing in a regular shoe but reports he is trying to elevate as much as possible.  Assessment & Plan: Visit Diagnoses:  1. Amputated toe of left foot (HCC)   2. Diabetic polyneuropathy associated with type 2 diabetes mellitus (HCC)     Plan: We will leave the sutures in place an additional week.  Again reinforced with the patient to minimize weightbearing as much as possible and to elevate as much as possible.  Continue the silver compression stocking.  He was also given written information concerning protein supplementation, probiotics, vitamin C and multivitamin and zinc.  He will follow-up in 1 week.  Follow-Up Instructions: Return in about 1 week (around 06/09/2018).   Ortho Exam  Patient is alert, oriented, no adenopathy, well-dressed, normal affect, normal respiratory effort. Sutures are intact and under mild tension and the knots are somewhat buried still.  There is mild widening of the incisional area with old dried blood and a minimal amount of serous drainage medially.  There is improved edema overall with the silver compression stocking and less erythema.  He has a palpable pedal pulse.  Imaging: No results  found.   Labs: Lab Results  Component Value Date   HGBA1C 8.1 (H) 11/09/2014   ESRSEDRATE 56 (H) 11/09/2014   CRP 17.1 (H) 11/09/2014   REPTSTATUS 11/17/2014 FINAL 11/12/2014   REPTSTATUS 11/16/2014 FINAL 11/12/2014   GRAMSTAIN  11/12/2014    RARE WBC PRESENT,BOTH PMN AND MONONUCLEAR NO SQUAMOUS EPITHELIAL CELLS SEEN NO ORGANISMS SEEN Performed at [facility]    GRAMSTAIN  11/12/2014    RARE WBC PRESENT, PREDOMINANTLY PMN NO SQUAMOUS EPITHELIAL CELLS SEEN NO ORGANISMS SEEN Performed at [facility]    CULT  11/12/2014    NO ANAEROBES ISOLATED Performed at [facility]    CULT  11/12/2014    RARE STAPHYLOCOCCUS AUREUS Note: RIFAMPIN AND GENTAMICIN SHOULD NOT BE USED AS SINGLE DRUGS FOR TREATMENT OF STAPH INFECTIONS. This organism is presumed to be Clindamycin resistant based on detection of inducible Clindamycin resistance. Performed at [facility]    [facility] STAPHYLOCOCCUS AUREUS 11/12/2014     Lab Results  Component Value Date   ALBUMIN 3.6 01/25/2015   ALBUMIN 3.0 (L) 11/09/2014   ALBUMIN 4.0 11/08/2014   PREALBUMIN 16.8 (L) 11/09/2014    Body mass index is 29.53 kg/m.  Orders:  No orders of the defined types were placed in this encounter.  No orders of the defined types were placed in this encounter.    Procedures:  No procedures performed  Clinical Data: No additional findings.  ROS:  All other systems negative, except as noted in the HPI. Review of Systems  Objective: Vital Signs: Ht 6\' 2"  (1.88 m)   Wt 230 lb (104.3 kg)   BMI 29.53 kg/m   Specialty Comments:  No specialty comments available.  PMFS History: Patient Active Problem List   Diagnosis Date Noted  . Great toe amputation status, left 04/27/2016  . Osteomyelitis of second toe of left foot (HCC)   . Diabetic neuropathy with neurologic complication (HCC)   . Sepsis (HCC) 11/10/2014  . Other pancytopenia (HCC) 11/10/2014  . Type II or  unspecified type diabetes mellitus with other specified manifestations, not stated as uncontrolled   . Type II or unspecified type diabetes mellitus without mention of complication, uncontrolled   . Hypothyroidism 11/09/2014  . History of shingles 11/09/2014  . Mood disorder (HCC) 11/09/2014  . Type 2 diabetes mellitus, uncontrolled (HCC) 11/09/2014  . Diabetic foot infection (HCC) 11/08/2014  . Mass of left side of neck 10/01/2014  . Tobacco use disorder 10/01/2014  . Proteinuria 10/01/2014   Past Medical History:  Diagnosis Date  . Anemia   . Atrial fibrillation (HCC)    not on any medication at this time - long time ago per patient  . Depression   . Diabetes mellitus without complication (HCC)    type 2  . Dyspnea    due to smoking  . History of shingles 2017   in right eye   . Hyperlipidemia   . Hypothyroidism   . Neuropathy    both feet   . Pneumonia   . Smoker   . Thyroid disease   . Wears glasses    reading    Family History  Problem Relation Age of Onset  . Diabetes Mother   . Diabetes Father     Past Surgical History:  Procedure Laterality Date  . AMPUTATION Left 11/11/2014   Procedure: Incision and drainage of left great toe;  Surgeon: Cammy Copa, MD;  Location: [facility] OR;  Service: Orthopedics;  Laterality: Left;  . AMPUTATION Left 04/20/2016   Procedure: Left Great Toe Amputation;  Surgeon: Nadara Mustard, MD;  Location: [facility] OR;  Service: Orthopedics;  Laterality: Left;  . AMPUTATION Left 05/16/2018   Procedure: LEFT 2ND TOE AMPUTATION;  Surgeon: Nadara Mustard, MD;  Location: [facility] OR;  Service: Orthopedics;  Laterality: Left;  . FRACTURE SURGERY     right ankle  . KNEE SURGERY Left    arthroscopic  . WISDOM TOOTH EXTRACTION     Social History   Occupational History  . Not on file  Tobacco Use  . Smoking status: Current Every Day Smoker    Packs/day: 1.00    Years: 40.00    Pack years: 40.00    Types: Cigarettes  . Smokeless tobacco: Never Used   Substance and Sexual Activity  . Alcohol use: No  . Drug use: No  . Sexual activity: Not on file

## 2018-06-09 ENCOUNTER — Other Ambulatory Visit: Payer: Self-pay

## 2018-06-09 ENCOUNTER — Ambulatory Visit (INDEPENDENT_AMBULATORY_CARE_PROVIDER_SITE_OTHER): Payer: Self-pay | Admitting: Orthopedic Surgery

## 2018-06-09 ENCOUNTER — Encounter (INDEPENDENT_AMBULATORY_CARE_PROVIDER_SITE_OTHER): Payer: Self-pay | Admitting: Orthopedic Surgery

## 2018-06-09 VITALS — Ht 74.0 in | Wt 230.0 lb

## 2018-06-09 DIAGNOSIS — S98132A Complete traumatic amputation of one left lesser toe, initial encounter: Secondary | ICD-10-CM

## 2018-06-09 DIAGNOSIS — Z89422 Acquired absence of other left toe(s): Secondary | ICD-10-CM

## 2018-06-09 NOTE — Progress Notes (Signed)
Office Visit Note   Patient: Darren Skinner           Date of Birth: June 05, 1960           MRN: [id number] Visit Date: 06/09/2018              Requested by: Daisy Florooss, Charles Alan, MD [address] [place], [place] [id number] PCP: Daisy Florooss, Charles Alan, MD  Chief Complaint  Patient presents with  . Left Foot - Routine Post Op    05/16/18 left foot 2nd toe amp      HPI: Patient is a 58 year old gentleman who presents follow-up about 3 weeks out from left foot second toe amputation he is also status post a great toe amputation is currently full weightbearing and compression stockings.  Assessment & Plan: Visit Diagnoses:  1. Amputated toe of left foot (HCC)     Plan: Harvest the sutures today continue with activities as tolerated reevaluate in 2 months.  Follow-Up Instructions: Return in about 2 months (around 08/09/2018).   Ortho Exam  Patient is alert, oriented, no adenopathy, well-dressed, normal affect, normal respiratory effort. Examination the incision is well-healed there is no redness no cellulitis no drainage no signs of infection no wound dehiscence.  Sutures harvested today.  Imaging: No results found. No images are attached to the encounter.  Labs: Lab Results  Component Value Date   HGBA1C 8.1 (H) 11/09/2014   ESRSEDRATE 56 (H) 11/09/2014   CRP 17.1 (H) 11/09/2014   REPTSTATUS 11/17/2014 FINAL 11/12/2014   REPTSTATUS 11/16/2014 FINAL 11/12/2014   GRAMSTAIN  11/12/2014    RARE WBC PRESENT,BOTH PMN AND MONONUCLEAR NO SQUAMOUS EPITHELIAL CELLS SEEN NO ORGANISMS SEEN Performed at [facility]    GRAMSTAIN  11/12/2014    RARE WBC PRESENT, PREDOMINANTLY PMN NO SQUAMOUS EPITHELIAL CELLS SEEN NO ORGANISMS SEEN Performed at [facility]    CULT  11/12/2014    NO ANAEROBES ISOLATED Performed at [facility]    CULT  11/12/2014    RARE STAPHYLOCOCCUS AUREUS Note: RIFAMPIN AND GENTAMICIN SHOULD NOT BE USED AS SINGLE DRUGS FOR TREATMENT  OF STAPH INFECTIONS. This organism is presumed to be Clindamycin resistant based on detection of inducible Clindamycin resistance. Performed at [facility]    LABORGA STAPHYLOCOCCUS AUREUS 11/12/2014     Lab Results  Component Value Date   ALBUMIN 3.6 01/25/2015   ALBUMIN 3.0 (L) 11/09/2014   ALBUMIN 4.0 11/08/2014   PREALBUMIN 16.8 (L) 11/09/2014    Body mass index is 29.53 kg/m.  Orders:  No orders of the defined types were placed in this encounter.  No orders of the defined types were placed in this encounter.    Procedures: No procedures performed  Clinical Data: No additional findings.  ROS:  All other systems negative, except as noted in the HPI. Review of Systems  Objective: Vital Signs: Ht 6\' 2"  (1.88 m)   Wt 230 lb (104.3 kg)   BMI 29.53 kg/m   Specialty Comments:  No specialty comments available.  PMFS History: Patient Active Problem List   Diagnosis Date Noted  . Great toe amputation status, left 04/27/2016  . Osteomyelitis of second toe of left foot (HCC)   . Diabetic neuropathy with neurologic complication (HCC)   . Sepsis (HCC) 11/10/2014  . Other pancytopenia (HCC) 11/10/2014  . Type II or unspecified type diabetes mellitus with other specified manifestations, not stated as uncontrolled   . Type II or unspecified type diabetes mellitus without mention  of complication, uncontrolled   . Hypothyroidism 11/09/2014  . History of shingles 11/09/2014  . Mood disorder (HCC) 11/09/2014  . Type 2 diabetes mellitus, uncontrolled (HCC) 11/09/2014  . Diabetic foot infection (HCC) 11/08/2014  . Mass of left side of neck 10/01/2014  . Tobacco use disorder 10/01/2014  . Proteinuria 10/01/2014   Past Medical History:  Diagnosis Date  . Anemia   . Atrial fibrillation (HCC)    not on any medication at this time - long time ago per patient  . Depression   . Diabetes mellitus without complication (HCC)    type 2  . Dyspnea    due to smoking   . History of shingles 2017   in right eye   . Hyperlipidemia   . Hypothyroidism   . Neuropathy    both feet   . Pneumonia   . Smoker   . Thyroid disease   . Wears glasses    reading    Family History  Problem Relation Age of Onset  . Diabetes Mother   . Diabetes Father     Past Surgical History:  Procedure Laterality Date  . AMPUTATION Left 11/11/2014   Procedure: Incision and drainage of left great toe;  Surgeon: Cammy Copa, MD;  Location: [facility] OR;  Service: Orthopedics;  Laterality: Left;  . AMPUTATION Left 04/20/2016   Procedure: Left Great Toe Amputation;  Surgeon: Nadara Mustard, MD;  Location: [facility] OR;  Service: Orthopedics;  Laterality: Left;  . AMPUTATION Left 05/16/2018   Procedure: LEFT 2ND TOE AMPUTATION;  Surgeon: Nadara Mustard, MD;  Location: [facility] OR;  Service: Orthopedics;  Laterality: Left;  . FRACTURE SURGERY     right ankle  . KNEE SURGERY Left    arthroscopic  . WISDOM TOOTH EXTRACTION     Social History   Occupational History  . Not on file  Tobacco Use  . Smoking status: Current Every Day Smoker    Packs/day: 1.00    Years: 40.00    Pack years: 40.00    Types: Cigarettes  . Smokeless tobacco: Never Used  Substance and Sexual Activity  . Alcohol use: No  . Drug use: No  . Sexual activity: Not on file

## 2018-08-11 ENCOUNTER — Ambulatory Visit: Payer: Self-pay | Admitting: Orthopedic Surgery

## 2019-09-20 DEATH — deceased

## 8386-10-21 DEATH — deceased
